# Patient Record
Sex: Male | Born: 1960 | Race: Black or African American | Hispanic: No | Marital: Married | Smoking: Never smoker
Health system: Southern US, Community
[De-identification: ages and names within clinical notes are randomized; demographics above are authoritative.]

## PROBLEM LIST (undated history)

## (undated) DIAGNOSIS — E119 Type 2 diabetes mellitus without complications: Secondary | ICD-10-CM

## (undated) DIAGNOSIS — Z8619 Personal history of other infectious and parasitic diseases: Secondary | ICD-10-CM

## (undated) DIAGNOSIS — M1990 Unspecified osteoarthritis, unspecified site: Secondary | ICD-10-CM

## (undated) DIAGNOSIS — K219 Gastro-esophageal reflux disease without esophagitis: Secondary | ICD-10-CM

## (undated) DIAGNOSIS — J189 Pneumonia, unspecified organism: Secondary | ICD-10-CM

## (undated) DIAGNOSIS — I10 Essential (primary) hypertension: Secondary | ICD-10-CM

## (undated) DIAGNOSIS — F419 Anxiety disorder, unspecified: Secondary | ICD-10-CM

## (undated) HISTORY — DX: Pneumonia, unspecified organism: J18.9

## (undated) HISTORY — DX: Personal history of other infectious and parasitic diseases: Z86.19

## (undated) HISTORY — PX: TONSILLECTOMY: SUR1361

## (undated) HISTORY — PX: COLONOSCOPY: SHX174

---

## 2003-04-20 ENCOUNTER — Encounter: Payer: Self-pay | Admitting: Cardiology

## 2003-04-20 ENCOUNTER — Ambulatory Visit (HOSPITAL_COMMUNITY): Admission: RE | Admit: 2003-04-20 | Discharge: 2003-04-20 | Payer: Self-pay | Admitting: Family Medicine

## 2005-10-01 ENCOUNTER — Emergency Department (HOSPITAL_COMMUNITY): Admission: EM | Admit: 2005-10-01 | Discharge: 2005-10-01 | Payer: Self-pay | Admitting: Family Medicine

## 2010-04-22 ENCOUNTER — Emergency Department (HOSPITAL_BASED_OUTPATIENT_CLINIC_OR_DEPARTMENT_OTHER)
Admission: EM | Admit: 2010-04-22 | Discharge: 2010-04-22 | Payer: Self-pay | Source: Home / Self Care | Admitting: Emergency Medicine

## 2010-04-22 ENCOUNTER — Ambulatory Visit: Payer: Self-pay | Admitting: Diagnostic Radiology

## 2013-10-11 ENCOUNTER — Emergency Department (HOSPITAL_BASED_OUTPATIENT_CLINIC_OR_DEPARTMENT_OTHER): Payer: BC Managed Care – PPO

## 2013-10-11 ENCOUNTER — Encounter (HOSPITAL_BASED_OUTPATIENT_CLINIC_OR_DEPARTMENT_OTHER): Payer: Self-pay | Admitting: Emergency Medicine

## 2013-10-11 ENCOUNTER — Emergency Department (HOSPITAL_BASED_OUTPATIENT_CLINIC_OR_DEPARTMENT_OTHER)
Admission: EM | Admit: 2013-10-11 | Discharge: 2013-10-11 | Disposition: A | Discharge status: Home / Self Care | Payer: BC Managed Care – PPO | Attending: Emergency Medicine | Admitting: Emergency Medicine

## 2013-10-11 DIAGNOSIS — B349 Viral infection, unspecified: Secondary | ICD-10-CM

## 2013-10-11 DIAGNOSIS — K0401 Reversible pulpitis: Secondary | ICD-10-CM

## 2013-10-11 DIAGNOSIS — Z79899 Other long term (current) drug therapy: Secondary | ICD-10-CM | POA: Insufficient documentation

## 2013-10-11 DIAGNOSIS — R197 Diarrhea, unspecified: Secondary | ICD-10-CM

## 2013-10-11 DIAGNOSIS — K029 Dental caries, unspecified: Secondary | ICD-10-CM | POA: Insufficient documentation

## 2013-10-11 DIAGNOSIS — B9789 Other viral agents as the cause of diseases classified elsewhere: Secondary | ICD-10-CM | POA: Insufficient documentation

## 2013-10-11 DIAGNOSIS — I10 Essential (primary) hypertension: Secondary | ICD-10-CM | POA: Insufficient documentation

## 2013-10-11 HISTORY — DX: Essential (primary) hypertension: I10

## 2013-10-11 LAB — CBC WITH DIFFERENTIAL/PLATELET
Band Neutrophils: 5 % (ref 0–10)
Basophils Absolute: 0 10*3/uL (ref 0.0–0.1)
Basophils Relative: 0 % (ref 0–1)
Eosinophils Absolute: 0 10*3/uL (ref 0.0–0.7)
Eosinophils Relative: 0 % (ref 0–5)
HCT: 36 % — ABNORMAL LOW (ref 39.0–52.0)
Hemoglobin: 12 g/dL — ABNORMAL LOW (ref 13.0–17.0)
Lymphocytes Relative: 32 % (ref 12–46)
Lymphs Abs: 1.6 10*3/uL (ref 0.7–4.0)
MCH: 29.9 pg (ref 26.0–34.0)
MCHC: 33.3 g/dL (ref 30.0–36.0)
MCV: 89.8 fL (ref 78.0–100.0)
Monocytes Absolute: 0.8 10*3/uL (ref 0.1–1.0)
Monocytes Relative: 15 % — ABNORMAL HIGH (ref 3–12)
Myelocytes: 1 %
Neutro Abs: 2.7 10*3/uL (ref 1.7–7.7)
Neutrophils Relative %: 47 % (ref 43–77)
Platelets: 128 10*3/uL — ABNORMAL LOW (ref 150–400)
RBC: 4.01 MIL/uL — ABNORMAL LOW (ref 4.22–5.81)
RDW: 13 % (ref 11.5–15.5)
WBC: 5.1 10*3/uL (ref 4.0–10.5)

## 2013-10-11 LAB — BASIC METABOLIC PANEL
BUN: 23 mg/dL (ref 6–23)
CO2: 23 mEq/L (ref 19–32)
Calcium: 8.8 mg/dL (ref 8.4–10.5)
Chloride: 103 mEq/L (ref 96–112)
Creatinine, Ser: 1.5 mg/dL — ABNORMAL HIGH (ref 0.50–1.35)
GFR calc Af Amer: 59 mL/min — ABNORMAL LOW (ref >90)
GFR calc non Af Amer: 51 mL/min — ABNORMAL LOW (ref >90)
Glucose, Bld: 130 mg/dL — ABNORMAL HIGH (ref 70–99)
Potassium: 4.3 mEq/L (ref 3.7–5.3)
Sodium: 140 mEq/L (ref 137–147)

## 2013-10-11 LAB — TROPONIN I: Troponin I: <0.3 ng/mL (ref <0.30)

## 2013-10-11 MED ORDER — PENICILLIN V POTASSIUM 500 MG PO TABS: 500.0000 mg | ORAL_TABLET | Freq: Four times a day (QID) | ORAL | Status: AC

## 2013-10-11 MED ORDER — KETOROLAC TROMETHAMINE 60 MG/2ML IM SOLN: 60.0000 mg | Freq: Once | INTRAMUSCULAR | Status: DC

## 2013-10-11 MED ORDER — ONDANSETRON 8 MG PO TBDP
8.0000 mg | ORAL_TABLET | Freq: Once | ORAL | Status: AC
  Administered 2013-10-11: 8 mg via ORAL
  Filled 2013-10-11: qty 1

## 2013-10-11 MED ORDER — NAPROXEN 250 MG PO TABS
500.0000 mg | ORAL_TABLET | Freq: Once | ORAL | Status: AC
  Administered 2013-10-11: 500 mg via ORAL
  Filled 2013-10-11: qty 2

## 2013-10-11 MED ORDER — BENZONATATE 100 MG PO CAPS: 100.0000 mg | ORAL_CAPSULE | Freq: Three times a day (TID) | ORAL | Status: DC

## 2013-10-11 MED ORDER — NAPROXEN 375 MG PO TABS: 375.0000 mg | ORAL_TABLET | Freq: Two times a day (BID) | ORAL | Status: DC

## 2013-10-11 NOTE — ED Provider Notes (Addendum)
CSN: [id number]     Arrival date & time 10/11/13  0422 History   First MD Initiated Contact with Patient 10/11/13 0435     Chief Complaint  Patient presents with  . Dental Pain  . Illegal value: [    pain with cough     (Consider location/radiation/quality/duration/timing/severity/associated sxs/prior Treatment) Patient is a 53 y.o. male presenting with URI. The history is provided by the patient and the spouse.  URI Presenting symptoms: cough and facial pain   Presenting symptoms: no ear pain and no fever   Severity:  Moderate Onset quality:  Gradual Duration:  4 days Timing:  Constant Progression:  Worsening Chronicity:  New Relieved by:  Nothing Worsened by:  Nothing tried Ineffective treatments:  None tried Associated symptoms: myalgias   Associated symptoms: no headaches, no neck pain and no wheezing   Associated symptoms comment:  Vomiting and diarrhea Myalgias:    Location:  Generalized   Quality:  Aching   Severity:  Moderate   Onset quality:  Gradual   Duration:  2 days   Timing:  Constant   Progression:  Unchanged Risk factors: sick contacts   Risk factors: not elderly and no recent illness     Past Medical History  Diagnosis Date  . Hypertension    History reviewed. No pertinent past surgical history. History reviewed. No pertinent family history. History  Substance Use Topics  . Smoking status: Never Smoker   . Smokeless tobacco: Never Used  . Alcohol Use: No    Review of Systems  Constitutional: Negative for fever.  HENT: Negative for drooling and ear pain.   Respiratory: Positive for cough. Negative for shortness of breath and wheezing.   Cardiovascular: Negative for chest pain, palpitations and leg swelling.  Gastrointestinal: Positive for nausea, vomiting, abdominal pain and diarrhea.  Musculoskeletal: Positive for myalgias. Negative for neck pain.  Skin: Negative for rash.  Neurological: Negative for dizziness, syncope, facial asymmetry,  speech difficulty, weakness, light-headedness, numbness and headaches.  All other systems reviewed and are negative.      Allergies  Review of patient's allergies indicates no known allergies.  Home Medications   Current Outpatient Rx  Name  Route  Sig  Dispense  Refill  . amLODipine (NORVASC) 10 MG tablet   Oral   Take 10 mg by mouth daily.         Marland Kitchen labetalol (NORMODYNE) 300 MG tablet   Oral   Take 300 mg by mouth 2 (two) times daily.         Marland Kitchen spironolactone (ALDACTONE) 50 MG tablet   Oral   Take 50 mg by mouth 2 (two) times daily.          BP 137/87  Pulse 67  Temp(Src) 98.7 F (37.1 C) (Oral)  Resp 18  Ht 6\' 2"  (1.88 m)  Wt 210 lb (95.255 kg)  BMI 26.95 kg/m2  SpO2 100% Physical Exam  Constitutional: He is oriented to person, place, and time. He appears well-developed and well-nourished.  HENT:  Head: Normocephalic and atraumatic. Head is without raccoon's eyes and without Battle's sign.  Right Ear: No mastoid tenderness.  Left Ear: No mastoid tenderness.  Mouth/Throat: Oropharynx is clear and moist. No oropharyngeal exudate.    Facial pain in cheek overlying upper teeth, no sinue tenderness  Eyes: Conjunctivae and EOM are normal. Pupils are equal, round, and reactive to light.  Neck: Normal range of motion. Neck supple.  Cardiovascular: Normal rate, regular rhythm and intact distal  pulses.   Pulmonary/Chest: Effort normal and breath sounds normal. He has no wheezes. He has no rales.  Abdominal: Soft. Bowel sounds are normal. There is no tenderness. There is no rebound and no guarding.  Musculoskeletal: Normal range of motion.  Neurological: He is alert and oriented to person, place, and time. He has normal reflexes. He displays normal reflexes. No cranial nerve deficit. He exhibits normal muscle tone. Coordination normal.  Intact sensation B  Skin: Skin is warm and dry.  Psychiatric: He has a normal mood and affect.    ED Course  Procedures  (including critical care time) Labs Review Labs Reviewed  CBC WITH DIFFERENTIAL  BASIC METABOLIC PANEL  TROPONIN I   Imaging Review No results found.   EKG Interpretation   Date/Time:  Monday October 11 2013 04:41:01 EST Ventricular Rate:  73 PR Interval:  184 QRS Duration: 114 QT Interval:  390 QTC Calculation: 429 R Axis:   4 Text Interpretation:  Normal sinus rhythm Confirmed by [facility]  MD,  Muneer Leider ([id number]) on 10/11/2013 4:50:34 AM      MDM  Rock Nephew:  0 Final diagnoses:  None  Doubt cardiac etiology, constellation of the patient's symptoms is viral in nature. Pain Patient has negative troponin and negative EKG.    Viral syndrome and facial pain consistent with dental caries and pulpitis.  Will treat nausea with zofran.  Cough with tesselon perles.  Will treat dental pain with naproxen and penicillin.  Follow up with your dentist.       Mallory Schaad K Taniaya Rudder-Rasch, MD 10/11/13 [phone]

## 2013-10-11 NOTE — ED Notes (Addendum)
Pt reports diarrhea, headache, left facial numbness, chest pain for 2 days. 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

## 2014-10-24 ENCOUNTER — Other Ambulatory Visit (INDEPENDENT_AMBULATORY_CARE_PROVIDER_SITE_OTHER): Payer: Self-pay | Admitting: Surgery

## 2014-10-24 DIAGNOSIS — R2230 Localized swelling, mass and lump, unspecified upper limb: Secondary | ICD-10-CM

## 2014-11-11 ENCOUNTER — Ambulatory Visit
Admission: RE | Admit: 2014-11-11 | Discharge: 2014-11-11 | Disposition: A | Discharge status: Home / Self Care | Payer: Self-pay | Source: Ambulatory Visit | Attending: Surgery | Admitting: Surgery

## 2014-11-11 MED ORDER — GADOBENATE DIMEGLUMINE 529 MG/ML IV SOLN
20.0000 mL | Freq: Once | INTRAVENOUS | Status: AC | PRN
  Administered 2014-11-11: 20 mL via INTRAVENOUS

## 2014-11-17 ENCOUNTER — Ambulatory Visit: Payer: Self-pay | Admitting: Surgery

## 2014-11-17 NOTE — H&P (Signed)
Justin Bates 10/24/2014 11:38 AM Location: [facility] Patient #: [id number] DOB: [birth year] Married / Language: English / Race: Black or African American Male  History of Present Illness Justin Bates A. Brent Taillon MD; 10/24/2014 12:40 PM) Patient words: right shoulder cyst  PT PRESENTS WITH 1 YEAR HISTORY OF RIGHT SHOULDER CYST. IT CAN BE PAINFIL AND SOMETIMES IS RED. LOCATION ANTERIOR RIGHT SHOULDER AND HAS ENLARGED. NO DRAINAGE OR HISTORY OF TRAUMA.  The patient is a 54 year old male    Other Problems Justin Bates, [place]; 10/24/2014 11:39 AM) High blood pressure  Past Surgical History Justin Bates, [place]; 10/24/2014 11:39 AM) No pertinent past surgical history  Diagnostic Studies History Justin Bates, [place]; 10/24/2014 11:39 AM) Colonoscopy never  Allergies Justin Bates, [place]; 10/24/2014 11:39 AM) No Known Drug Allergies03/14/2016  Medication History Justin Bates, [place]; 10/24/2014 11:40 AM) AmLODIPine Besylate (10MG  Tablet, Oral) Active. Labetalol HCl (300MG  Tablet, Oral) Active. Spironolactone (50MG  Tablet, Oral) Active.  Social History Justin Bates, [place]; 10/24/2014 11:39 AM) Caffeine use Tea. No alcohol use No drug use Tobacco use Never smoker.  Family History Justin Bates, [place]; 10/24/2014 11:39 AM) Cerebrovascular Accident Father, Mother. Hypertension Father, Mother.  Review of Systems (Justin Bates. Justin Bates; 10/24/2014 11:39 AM) General Not Present- Appetite Loss, Chills, Fatigue, Fever, Night Sweats, Weight Gain and Weight Loss. Skin Not Present- Change in Wart/Mole, Dryness, Hives, Jaundice, New Lesions, Non-Healing Wounds, Rash and Ulcer. HEENT Not Present- Earache, Hearing Loss, Hoarseness, Nose Bleed, Oral Ulcers, Ringing in the Ears, Seasonal Allergies, Sinus Pain, Sore Throat, Visual Disturbances, Wears glasses/contact lenses and Yellow Eyes. Respiratory Not Present- Bloody sputum, Chronic Cough, Difficulty  Breathing, Snoring and Wheezing. Breast Not Present- Breast Mass, Breast Pain, Nipple Discharge and Skin Changes. Cardiovascular Not Present- Chest Pain, Difficulty Breathing Lying Down, Leg Cramps, Palpitations, Rapid Heart Rate, Shortness of Breath and Swelling of Extremities. Gastrointestinal Not Present- Abdominal Pain, Bloating, Bloody Stool, Change in Bowel Habits, Chronic diarrhea, Constipation, Difficulty Swallowing, Excessive gas, Gets full quickly at meals, Hemorrhoids, Indigestion, Nausea, Rectal Pain and Vomiting. Male Genitourinary Not Present- Blood in Urine, Change in Urinary Stream, Frequency, Impotence, Nocturia, Painful Urination, Urgency and Urine Leakage.   Vitals [employer] R. Justin Bates; 10/24/2014 11:38 AM) 10/24/2014 11:38 AM Weight: 244.5 lb Height: 78in Body Surface Area: 2.47 m Body Mass Index: 28.25 kg/m BP: 126/82 (Sitting, Left Arm, Standard)    Physical Exam (Mckenna Boruff A. Harlyn Rathmann MD; 10/24/2014 12:41 PM) General Mental Status-Alert. General Appearance-Consistent with stated age. Hydration-Well hydrated. Voice-Normal.  Integumentary Note: 6 CM X 6 CM LOBULAR MASS ANTERIOR RIGHT SHOULDER NOT FIXED OR FLUCTUAMT MOBILE SOME RIGHT SHOULDER PAIN WITH MOVEMENT.   Chest and Lung Exam Chest and lung exam reveals -quiet, even and easy respiratory effort with no use of accessory muscles and on auscultation, normal breath sounds, no adventitious sounds and normal vocal resonance. Inspection Chest Wall - Normal. Back - normal.  Neurologic Neurologic evaluation reveals -alert and oriented x 3 with no impairment of recent or remote memory. Mental Status-Normal.  Musculoskeletal Normal Exam - Left-Upper Extremity Strength Normal and Lower Extremity Strength Normal. Normal Exam - Right-Upper Extremity Strength Normal and Lower Extremity Strength Normal.  Lymphatic Head & Neck  General Head & Neck Lymphatics: Bilateral - Description -  Normal. Axillary  General Axillary Region: Bilateral - Description - Normal. Tenderness - Non Tender. Femoral & Inguinal  Generalized Femoral & Inguinal Lymphatics: Bilateral - Description - Normal. Tenderness - Non Tender.    Assessment &  Plan ([name] A. Orra Nolde MD; 10/24/2014 12:05 PM) MASS OF SKIN OF RIGHT SHOULDER (782.2  R22.31) PAIN OF RIGHT SHOULDER REGION (719.41  M25.511) Impression: will need MRI to evaluate right shoulder mass and pain in right shoulder joint. if it is not involving any deep structures, nerves arteries or veins, will proceed with excision. if it involves joint space etc will need referal to specialist. risk of surgery are bleeding infection, nerve injury and sequela, blood vessel injuy joint space injury and the need for more surgery. He understands and agrees. Current Plans  Pt Education - POST OP   Signed by Turner Daniels, MD (10/24/2014 12:42 PM)

## 2014-12-16 ENCOUNTER — Encounter (HOSPITAL_BASED_OUTPATIENT_CLINIC_OR_DEPARTMENT_OTHER): Payer: Self-pay | Admitting: *Deleted

## 2014-12-16 NOTE — Progress Notes (Signed)
Coming for BMET and Ekg. Bring all medications.

## 2014-12-20 ENCOUNTER — Encounter (HOSPITAL_BASED_OUTPATIENT_CLINIC_OR_DEPARTMENT_OTHER)
Admission: RE | Admit: 2014-12-20 | Discharge: 2014-12-20 | Disposition: A | Discharge status: Home / Self Care | Payer: BLUE CROSS/BLUE SHIELD | Source: Ambulatory Visit | Attending: Surgery | Admitting: Surgery

## 2014-12-20 DIAGNOSIS — R9431 Abnormal electrocardiogram [ECG] [EKG]: Secondary | ICD-10-CM | POA: Diagnosis not present

## 2014-12-20 DIAGNOSIS — D1721 Benign lipomatous neoplasm of skin and subcutaneous tissue of right arm: Secondary | ICD-10-CM | POA: Diagnosis not present

## 2014-12-20 DIAGNOSIS — Z79899 Other long term (current) drug therapy: Secondary | ICD-10-CM | POA: Diagnosis not present

## 2014-12-20 DIAGNOSIS — I4510 Unspecified right bundle-branch block: Secondary | ICD-10-CM | POA: Diagnosis not present

## 2014-12-20 DIAGNOSIS — N289 Disorder of kidney and ureter, unspecified: Secondary | ICD-10-CM | POA: Diagnosis not present

## 2014-12-20 DIAGNOSIS — I10 Essential (primary) hypertension: Secondary | ICD-10-CM | POA: Diagnosis not present

## 2014-12-20 LAB — BASIC METABOLIC PANEL
Anion gap: 8 (ref 5–15)
BUN: 14 mg/dL (ref 6–20)
CO2: 29 mmol/L (ref 22–32)
Calcium: 8.9 mg/dL (ref 8.9–10.3)
Chloride: 103 mmol/L (ref 101–111)
Creatinine, Ser: 1.22 mg/dL (ref 0.61–1.24)
GFR calc Af Amer: >60 mL/min (ref >60)
GFR calc non Af Amer: >60 mL/min (ref >60)
Glucose, Bld: 114 mg/dL — ABNORMAL HIGH (ref 70–99)
Potassium: 3.7 mmol/L (ref 3.5–5.1)
Sodium: 140 mmol/L (ref 135–145)

## 2014-12-20 NOTE — Progress Notes (Signed)
Pt in for Pre op labwork and EKG.  EKG reviewed by Dr. Conrad Gilliam.

## 2014-12-21 ENCOUNTER — Ambulatory Visit (HOSPITAL_BASED_OUTPATIENT_CLINIC_OR_DEPARTMENT_OTHER): Payer: BLUE CROSS/BLUE SHIELD | Admitting: Anesthesiology

## 2014-12-21 ENCOUNTER — Ambulatory Visit (HOSPITAL_BASED_OUTPATIENT_CLINIC_OR_DEPARTMENT_OTHER)
Admission: RE | Admit: 2014-12-21 | Discharge: 2014-12-21 | Disposition: A | Discharge status: Home / Self Care | Payer: BLUE CROSS/BLUE SHIELD | Source: Ambulatory Visit | Attending: Surgery | Admitting: Surgery

## 2014-12-21 ENCOUNTER — Encounter (HOSPITAL_BASED_OUTPATIENT_CLINIC_OR_DEPARTMENT_OTHER): Payer: Self-pay | Admitting: *Deleted

## 2014-12-21 ENCOUNTER — Encounter (HOSPITAL_BASED_OUTPATIENT_CLINIC_OR_DEPARTMENT_OTHER)
Admission: RE | Disposition: A | Discharge status: Home / Self Care | Payer: Self-pay | Source: Ambulatory Visit | Attending: Surgery

## 2014-12-21 DIAGNOSIS — D1721 Benign lipomatous neoplasm of skin and subcutaneous tissue of right arm: Secondary | ICD-10-CM | POA: Insufficient documentation

## 2014-12-21 DIAGNOSIS — I10 Essential (primary) hypertension: Secondary | ICD-10-CM | POA: Insufficient documentation

## 2014-12-21 DIAGNOSIS — Z79899 Other long term (current) drug therapy: Secondary | ICD-10-CM | POA: Insufficient documentation

## 2014-12-21 DIAGNOSIS — N289 Disorder of kidney and ureter, unspecified: Secondary | ICD-10-CM | POA: Insufficient documentation

## 2014-12-21 DIAGNOSIS — R9431 Abnormal electrocardiogram [ECG] [EKG]: Secondary | ICD-10-CM | POA: Insufficient documentation

## 2014-12-21 DIAGNOSIS — I4510 Unspecified right bundle-branch block: Secondary | ICD-10-CM | POA: Insufficient documentation

## 2014-12-21 HISTORY — PX: LIPOMA EXCISION: SHX5283

## 2014-12-21 LAB — POCT HEMOGLOBIN-HEMACUE: Hemoglobin: 12.8 g/dL — ABNORMAL LOW (ref 13.0–17.0)

## 2014-12-21 SURGERY — EXCISION LIPOMA
Anesthesia: General | Site: Shoulder | Laterality: Right

## 2014-12-21 MED ORDER — CEFAZOLIN SODIUM-DEXTROSE 2-3 GM-% IV SOLR
2.0000 g | INTRAVENOUS | Status: AC
  Administered 2014-12-21: 2 g via INTRAVENOUS

## 2014-12-21 MED ORDER — FENTANYL CITRATE (PF) 100 MCG/2ML IJ SOLN
INTRAMUSCULAR | Status: DC | PRN
  Administered 2014-12-21: 100 ug via INTRAVENOUS

## 2014-12-21 MED ORDER — HYDRALAZINE HCL 20 MG/ML IJ SOLN
5.0000 mg | Freq: Once | INTRAMUSCULAR | Status: AC
  Administered 2014-12-21: 5 mg via INTRAVENOUS

## 2014-12-21 MED ORDER — HYDROCODONE-ACETAMINOPHEN 7.5-325 MG PO TABS: 1.0000 | ORAL_TABLET | Freq: Once | ORAL | Status: DC | PRN

## 2014-12-21 MED ORDER — FENTANYL CITRATE (PF) 100 MCG/2ML IJ SOLN: 50.0000 ug | INTRAMUSCULAR | Status: DC | PRN

## 2014-12-21 MED ORDER — FENTANYL CITRATE (PF) 100 MCG/2ML IJ SOLN
INTRAMUSCULAR | Status: AC
  Filled 2014-12-21: qty 4

## 2014-12-21 MED ORDER — HYDROMORPHONE HCL 1 MG/ML IJ SOLN: 0.2500 mg | INTRAMUSCULAR | Status: DC | PRN

## 2014-12-21 MED ORDER — MIDAZOLAM HCL 2 MG/2ML IJ SOLN
1.0000 mg | INTRAMUSCULAR | Status: DC | PRN
  Administered 2014-12-21: 2 mg via INTRAVENOUS

## 2014-12-21 MED ORDER — PROMETHAZINE HCL 25 MG/ML IJ SOLN: 6.2500 mg | INTRAMUSCULAR | Status: DC | PRN

## 2014-12-21 MED ORDER — OXYCODONE-ACETAMINOPHEN 5-325 MG PO TABS: 1.0000 | ORAL_TABLET | ORAL | Status: DC | PRN

## 2014-12-21 MED ORDER — LIDOCAINE HCL (CARDIAC) 20 MG/ML IV SOLN
INTRAVENOUS | Status: DC | PRN
  Administered 2014-12-21: 100 mg via INTRAVENOUS

## 2014-12-21 MED ORDER — HYDRALAZINE HCL 20 MG/ML IJ SOLN
INTRAMUSCULAR | Status: AC
  Filled 2014-12-21: qty 1

## 2014-12-21 MED ORDER — BUPIVACAINE-EPINEPHRINE 0.25% -1:200000 IJ SOLN
INTRAMUSCULAR | Status: DC | PRN
  Administered 2014-12-21: 10 mL

## 2014-12-21 MED ORDER — PROPOFOL 500 MG/50ML IV EMUL
INTRAVENOUS | Status: AC
  Filled 2014-12-21: qty 50

## 2014-12-21 MED ORDER — GLYCOPYRROLATE 0.2 MG/ML IJ SOLN: 0.2000 mg | Freq: Once | INTRAMUSCULAR | Status: DC | PRN

## 2014-12-21 MED ORDER — PROPOFOL 10 MG/ML IV BOLUS
INTRAVENOUS | Status: DC | PRN
  Administered 2014-12-21: 250 mg via INTRAVENOUS

## 2014-12-21 MED ORDER — LABETALOL HCL 5 MG/ML IV SOLN
5.0000 mg | Freq: Once | INTRAVENOUS | Status: AC
  Administered 2014-12-21: 5 mg via INTRAVENOUS

## 2014-12-21 MED ORDER — CHLORHEXIDINE GLUCONATE 4 % EX LIQD: 1.0000 "application " | Freq: Once | CUTANEOUS | Status: DC

## 2014-12-21 MED ORDER — LABETALOL HCL 5 MG/ML IV SOLN
INTRAVENOUS | Status: AC
  Filled 2014-12-21: qty 4

## 2014-12-21 MED ORDER — DEXAMETHASONE SODIUM PHOSPHATE 4 MG/ML IJ SOLN
INTRAMUSCULAR | Status: DC | PRN
  Administered 2014-12-21: 10 mg via INTRAVENOUS

## 2014-12-21 MED ORDER — LACTATED RINGERS IV SOLN
INTRAVENOUS | Status: DC
  Administered 2014-12-21 (×3): via INTRAVENOUS

## 2014-12-21 MED ORDER — MIDAZOLAM HCL 2 MG/2ML IJ SOLN
INTRAMUSCULAR | Status: AC
  Filled 2014-12-21: qty 2

## 2014-12-21 SURGICAL SUPPLY — 45 items
APL SKNCLS STERI-STRIP NONHPOA (GAUZE/BANDAGES/DRESSINGS)
BENZOIN TINCTURE PRP APPL 2/3 (GAUZE/BANDAGES/DRESSINGS) IMPLANT
BLADE SURG 10 STRL SS (BLADE) IMPLANT
BLADE SURG 15 STRL LF DISP TIS (BLADE) ×1 IMPLANT
BLADE SURG 15 STRL SS (BLADE) ×3
CANISTER SUCT 1200ML W/VALVE (MISCELLANEOUS) IMPLANT
CHLORAPREP W/TINT 26ML (MISCELLANEOUS) ×3 IMPLANT
CLOSURE WOUND 1/2 X4 (GAUZE/BANDAGES/DRESSINGS)
COVER BACK TABLE 60X90IN (DRAPES) ×3 IMPLANT
COVER MAYO STAND STRL (DRAPES) ×3 IMPLANT
DECANTER SPIKE VIAL GLASS SM (MISCELLANEOUS) IMPLANT
DRAPE LAPAROTOMY 100X72 PEDS (DRAPES) ×3 IMPLANT
DRAPE UTILITY XL STRL (DRAPES) ×3 IMPLANT
ELECT COATED BLADE 2.86 ST (ELECTRODE) ×3 IMPLANT
ELECT REM PT RETURN 9FT ADLT (ELECTROSURGICAL) ×3
ELECTRODE REM PT RTRN 9FT ADLT (ELECTROSURGICAL) ×1 IMPLANT
GLOVE BIOGEL M 7.0 STRL (GLOVE) ×2 IMPLANT
GLOVE BIOGEL PI IND STRL 7.5 (GLOVE) IMPLANT
GLOVE BIOGEL PI IND STRL 8 (GLOVE) ×1 IMPLANT
GLOVE BIOGEL PI INDICATOR 7.5 (GLOVE) ×4
GLOVE BIOGEL PI INDICATOR 8 (GLOVE) ×2
GLOVE ECLIPSE 8.0 STRL XLNG CF (GLOVE) ×3 IMPLANT
GOWN STRL REUS W/ TWL LRG LVL3 (GOWN DISPOSABLE) ×2 IMPLANT
GOWN STRL REUS W/TWL LRG LVL3 (GOWN DISPOSABLE) ×6
LIQUID BAND (GAUZE/BANDAGES/DRESSINGS) ×2 IMPLANT
NDL HYPO 25X1 1.5 SAFETY (NEEDLE) ×1 IMPLANT
NEEDLE HYPO 25X1 1.5 SAFETY (NEEDLE) ×3 IMPLANT
NS IRRIG 1000ML POUR BTL (IV SOLUTION) IMPLANT
PACK BASIN DAY SURGERY FS (CUSTOM PROCEDURE TRAY) ×3 IMPLANT
PENCIL BUTTON HOLSTER BLD 10FT (ELECTRODE) ×3 IMPLANT
SLEEVE SCD COMPRESS KNEE MED (MISCELLANEOUS) ×3 IMPLANT
SPONGE LAP 4X18 X RAY DECT (DISPOSABLE) ×2 IMPLANT
STAPLER VISISTAT 35W (STAPLE) IMPLANT
STRIP CLOSURE SKIN 1/2X4 (GAUZE/BANDAGES/DRESSINGS) IMPLANT
SUT MON AB 4-0 PC3 18 (SUTURE) ×3 IMPLANT
SUT VIC AB 3-0 SH 27 (SUTURE)
SUT VIC AB 3-0 SH 27X BRD (SUTURE) IMPLANT
SUT VICRYL 3-0 CR8 SH (SUTURE) IMPLANT
SUT VICRYL AB 3 0 TIES (SUTURE) IMPLANT
SYR CONTROL 10ML LL (SYRINGE) ×3 IMPLANT
TOWEL OR 17X24 6PK STRL BLUE (TOWEL DISPOSABLE) ×2 IMPLANT
TOWEL OR NON WOVEN STRL DISP B (DISPOSABLE) ×3 IMPLANT
TUBE CONNECTING 20'X1/4 (TUBING)
TUBE CONNECTING 20X1/4 (TUBING) IMPLANT
YANKAUER SUCT BULB TIP NO VENT (SUCTIONS) IMPLANT

## 2014-12-21 NOTE — Anesthesia Procedure Notes (Signed)
Procedure Name: LMA Insertion Date/Time: 12/21/2014 11:35 AM Performed by: Lyndee Leo Pre-anesthesia Checklist: Patient identified, Emergency Drugs available, Suction available and Patient being monitored Patient Re-evaluated:Patient Re-evaluated prior to inductionOxygen Delivery Method: Circle System Utilized Preoxygenation: Pre-oxygenation with 100% oxygen Intubation Type: IV induction Ventilation: Mask ventilation without difficulty LMA: LMA inserted LMA Size: 5.0 Number of attempts: 1 Airway Equipment and Method: Bite block Placement Confirmation: positive ETCO2 Tube secured with: Tape Dental Injury: Teeth and Oropharynx as per pre-operative assessment

## 2014-12-21 NOTE — Anesthesia Preprocedure Evaluation (Addendum)
Anesthesia Evaluation  Patient identified by MRN, date of birth, ID band Patient awake    Reviewed: Allergy & Precautions, NPO status , Patient's Chart, lab work & pertinent test results  Airway Mallampati: II  TM Distance: >3 FB Neck ROM: Full    Dental  (+) Teeth Intact   Pulmonary neg pulmonary ROS,  breath sounds clear to auscultation        Cardiovascular hypertension, Pt. on medications Rhythm:Regular Rate:Normal     Neuro/Psych negative neurological ROS     GI/Hepatic negative GI ROS, Neg liver ROS,   Endo/Other  negative endocrine ROS  Renal/GU Renal InsufficiencyRenal disease     Musculoskeletal   Abdominal   Peds  Hematology negative hematology ROS (+)   Anesthesia Other Findings   Reproductive/Obstetrics                            Anesthesia Physical Anesthesia Plan  ASA: II  Anesthesia Plan: General   Post-op Pain Management:    Induction: Intravenous  Airway Management Planned: LMA  Additional Equipment:   Intra-op Plan:   Post-operative Plan: Extubation in OR  Informed Consent: I have reviewed the patients History and Physical, chart, labs and discussed the procedure including the risks, benefits and alternatives for the proposed anesthesia with the patient or authorized representative who has indicated his/her understanding and acceptance.   Dental advisory given  Plan Discussed with: CRNA  Anesthesia Plan Comments:        Anesthesia Quick Evaluation

## 2014-12-21 NOTE — Transfer of Care (Signed)
Immediate Anesthesia Transfer of Care Note  Patient: Justin Bates  Procedure(s) Performed: Procedure(s): EXCISION RIGHT SHOULDER  LIPOMA (Right)  Patient Location: PACU  Anesthesia Type:General  Level of Consciousness: awake, sedated and patient cooperative  Airway & Oxygen Therapy: Patient Spontanous Breathing and Patient connected to face mask oxygen  Post-op Assessment: Report given to RN and Post -op Vital signs reviewed and stable  Post vital signs: Reviewed and stable  Last Vitals:  Filed Vitals:   12/21/14 0930  BP: 159/108  Pulse: 70  Temp: 36.9 C  Resp: 20    Complications: No apparent anesthesia complications

## 2014-12-21 NOTE — Interval H&P Note (Signed)
History and Physical Interval Note:  12/21/2014 9:34 AM  Justin Bates  has presented today for surgery, with the diagnosis of Right Shoulder Lipoma  The various methods of treatment have been discussed with the patient and family. After consideration of risks, benefits and other options for treatment, the patient has consented to  Procedure(s): EXCISION RIGHT SHOULDER  LIPOMA (Right) as a surgical intervention .  The patient's history has been reviewed, patient examined, no change in status, stable for surgery.  I have reviewed the patient's chart and labs.  Questions were answered to the patient's satisfaction.     Jorita Bohanon A.

## 2014-12-21 NOTE — Op Note (Signed)
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  Anesthesia: LMA with 0.25% Sensorcaine local with epinephrine  EBL: Minimal  Specimen: Right shoulder lipoma  Drains: None  Indications for procedure: The patient is a 54 year old male with a mass on his right shoulder. MRI was done which showed a subcutaneous lipoma. He also had some intra-articular changes. He desired excision of the lipoma. I explained that the lipoma was probably not causing shoulder pain but it had enlarged in the patient still desired excision. Risks, benefits and alternatives were discussed with the patient.The procedure has been discussed with the patient.  Alternative therapies have been discussed with the patient.  Operative risks include bleeding,  Infection,  Organ injury,  Nerve injury,  Blood vessel injury,  DVT,  Pulmonary embolism,  Death,  And possible reoperation.  Medical management risks include worsening of present situation.  The success of the procedure is 50 -90 % at treating patients symptoms.  The patient understands and agrees to proceed.  Description of procedure: Patient met in holding area and right shoulder marked as correct side. Patient then taken back to the operating room and placed upon the OR table. LMA anesthesia initiated without difficulty. Right shoulder prepped and draped in a sterile fashion. The lesion was anterior. Timeout was done. Proper patient and procedure verified. He received preoperative Ancef. 0.25% Sensorcaine was infiltrated the skin overlying the mass. Incision was made in a vertical fashion over the mass and a lipoma was encountered. This was excised in its entirety. This was separate from the deltoid muscles. This did not involve the muscle. Area was found hemostatic with cautery and closed with 3-0 Vicryl and 4-0 Monocryl. Liquid adhesive applied.  All final counts found to be correct. Patient awoke taken to recovery in satisfactory condition.

## 2014-12-21 NOTE — Discharge Instructions (Signed)
GENERAL SURGERY: POST OP INSTRUCTIONS ° °1. DIET: Follow a light bland diet the first 24 hours after arrival home, such as soup, liquids, crackers, etc.  Be sure to include lots of fluids daily.  Avoid fast food or heavy meals as your are more likely to get nauseated.   °2. Take your usually prescribed home medications unless otherwise directed. °3. PAIN CONTROL: °a. Pain is best controlled by a usual combination of three different methods TOGETHER: °i. Ice/Heat °ii. Over the counter pain medication °iii. Prescription pain medication °b. Most patients will experience some swelling and bruising around the incisions.  Ice packs or heating pads (30-60 minutes up to 6 times a day) will help. Use ice for the first few days to help decrease swelling and bruising, then switch to heat to help relax tight/sore spots and speed recovery.  Some people prefer to use ice alone, heat alone, alternating between ice & heat.  Experiment to what works for you.  Swelling and bruising can take several weeks to resolve.   °c. It is helpful to take an over-the-counter pain medication regularly for the first few weeks.  Choose one of the following that works best for you: °i. Naproxen (Aleve, etc)  Two 220mg tabs twice a day °ii. Ibuprofen (Advil, etc) Three 200mg tabs four times a day (every meal & bedtime) °iii. Acetaminophen (Tylenol, etc) 500-650mg four times a day (every meal & bedtime) °d. A  prescription for pain medication (such as oxycodone, hydrocodone, etc) should be given to you upon discharge.  Take your pain medication as prescribed.  °i. If you are having problems/concerns with the prescription medicine (does not control pain, nausea, vomiting, rash, itching, etc), please call us [phone] to see if we need to switch you to a different pain medicine that will work better for you and/or control your side effect better. °ii. If you need a refill on your pain medication, please contact your pharmacy.  They will contact our  office to request authorization. Prescriptions will not be filled after 5 pm or on week-ends. °4. Avoid getting constipated.  Between the surgery and the pain medications, it is common to experience some constipation.  Increasing fluid intake and taking a fiber supplement (such as Metamucil, Citrucel, FiberCon, MiraLax, etc) 1-2 times a day regularly will usually help prevent this problem from occurring.  A mild laxative (prune juice, Milk of Magnesia, MiraLax, etc) should be taken according to package directions if there are no bowel movements after 48 hours.   °5. Wash / shower every day.  You may shower over the dressings as they are waterproof.  Continue to shower over incision(s) after the dressing is off. °6. Remove your waterproof bandages 5 days after surgery.  You may leave the incision open to air.  You may have skin tapes (Steri Strips) covering the incision(s).  Leave them on until one week, then remove.  You may replace a dressing/Band-Aid to cover the incision for comfort if you wish.  ° ° ° ° °7. ACTIVITIES as tolerated:   °a. You may resume regular (light) daily activities beginning the next day--such as daily self-care, walking, climbing stairs--gradually increasing activities as tolerated.  If you can walk 30 minutes without difficulty, it is safe to try more intense activity such as jogging, treadmill, bicycling, low-impact aerobics, swimming, etc. °b. Save the most intensive and strenuous activity for last such as sit-ups, heavy lifting, contact sports, etc  Refrain from any heavy lifting or straining until you   are off narcotics for pain control.   °c. DO NOT PUSH THROUGH PAIN.  Let pain be your guide: If it hurts to do something, don't do it.  Pain is your body warning you to avoid that activity for another week until the pain goes down. °d. You may drive when you are no longer taking prescription pain medication, you can comfortably wear a seatbelt, and you can safely maneuver your car and  apply brakes. °e. You may have sexual intercourse when it is comfortable.  °8. FOLLOW UP in our office °a. Please call [facility] at [phone] to set up an appointment to see your surgeon in the office for a follow-up appointment approximately 2-3 weeks after your surgery. °b. Make sure that you call for this appointment the day you arrive home to insure a convenient appointment time. °9. IF YOU HAVE DISABILITY OR FAMILY LEAVE FORMS, BRING THEM TO THE OFFICE FOR PROCESSING.  DO NOT GIVE THEM TO YOUR DOCTOR. ° ° °WHEN TO CALL US [phone]: °1. Poor pain control °2. Reactions / problems with new medications (rash/itching, nausea, etc)  °3. Fever over 101.5 F (38.5 C) °4. Worsening swelling or bruising °5. Continued bleeding from incision. °6. Increased pain, redness, or drainage from the incision °7. Difficulty breathing / swallowing ° ° The clinic staff is available to answer your questions during regular business hours (8:30am-5pm).  Please don’t hesitate to call and ask to speak to one of our nurses for clinical concerns.  ° If you have a medical emergency, go to the nearest emergency room or call 911. ° A surgeon from [facility] is always on call at the hospitals ° ° °[facility] °[address], [address], [place], [place]  [address] ? °MAIN: [phone] ? TOLL FREE: [phone] ?  °FAX [phone] °[email or web address] ° °Post Anesthesia Home Care Instructions ° °Activity: °Get plenty of rest for the remainder of the day. A responsible adult should stay with you for 24 hours following the procedure.  °For the next 24 hours, DO NOT: °-Drive a car °-Operate machinery °-Drink alcoholic beverages °-Take any medication unless instructed by your physician °-Make any legal decisions or sign important papers. ° °Meals: °Start with liquid foods such as gelatin or soup. Progress to regular foods as tolerated. Avoid greasy, spicy, heavy foods. If nausea and/or  vomiting occur, drink only clear liquids until the nausea and/or vomiting subsides. Call your physician if vomiting continues. ° °Special Instructions/Symptoms: °Your throat may feel dry or sore from the anesthesia or the breathing tube placed in your throat during surgery. If this causes discomfort, gargle with warm salt water. The discomfort should disappear within 24 hours. ° °If you had a scopolamine patch placed behind your ear for the management of post- operative nausea and/or vomiting: ° °1. The medication in the patch is effective for 72 hours, after which it should be removed.  Wrap patch in a tissue and discard in the trash. Wash hands thoroughly with soap and water. °2. You may remove the patch earlier than 72 hours if you experience unpleasant side effects which may include dry mouth, dizziness or visual disturbances. °3. Avoid touching the patch. Wash your hands with soap and water after contact with the patch. °  ° °

## 2014-12-21 NOTE — H&P (Signed)
H&P   SPURGEON GANCARZ (MR# [id number])      H&P Info    Chief Strategy Officer Note Status Last Update User Last Update Date/Time   Erroll Luna, MD Signed Erroll Luna, MD 11/17/2014 3:06 PM    H&P    Expand All Collapse All   Ramin Zoll 10/24/2014 11:38 AM Location: [facility] Patient #: [id number] DOB: 27-Dec-1960 Married / Language: English / Race: Black or African American Male  History of Present Illness [name] A. Jock Mahon MD; 10/24/2014 12:40 PM) Patient words: right shoulder cyst  PT PRESENTS WITH 1 YEAR HISTORY OF RIGHT SHOULDER CYST. IT CAN BE PAINFIL AND SOMETIMES IS RED. LOCATION ANTERIOR RIGHT SHOULDER AND HAS ENLARGED. NO DRAINAGE OR HISTORY OF TRAUMA.  The patient is a 54 year old male    Other Problems [name], [place]; 10/24/2014 11:39 AM) High blood pressure  Past Surgical History [name], [place]; 10/24/2014 11:39 AM) No pertinent past surgical history  Diagnostic Studies History [name], [place]; 10/24/2014 11:39 AM) Colonoscopy never  Allergies [name], CMA; 10/24/2014 11:39 AM) No Known Drug Allergies03/14/2016  Medication History [name], CMA; 10/24/2014 11:40 AM) AmLODIPine Besylate (10MG  Tablet, Oral) Active. Labetalol HCl (300MG  Tablet, Oral) Active. Spironolactone (50MG  Tablet, Oral) Active.  Social History [name], [place]; 10/24/2014 11:39 AM) Caffeine use Tea. No alcohol use No drug use Tobacco use Never smoker.  Family History [name], [place]; 10/24/2014 11:39 AM) Cerebrovascular Accident Father, Mother. Hypertension Father, Mother.  Review of Systems ([place]. Brooks CMA; 10/24/2014 11:39 AM) General Not Present- Appetite Loss, Chills, Fatigue, Fever, Night Sweats, Weight Gain and Weight Loss. Skin Not Present- Change in Wart/Mole, Dryness, Hives, Jaundice, New Lesions, Non-Healing Wounds, Rash and Ulcer. HEENT Not Present- Earache, Hearing Loss, Hoarseness, Nose  Bleed, Oral Ulcers, Ringing in the Ears, Seasonal Allergies, Sinus Pain, Sore Throat, Visual Disturbances, Wears glasses/contact lenses and Yellow Eyes. Respiratory Not Present- Bloody sputum, Chronic Cough, Difficulty Breathing, Snoring and Wheezing. Breast Not Present- Breast Mass, Breast Pain, Nipple Discharge and Skin Changes. Cardiovascular Not Present- Chest Pain, Difficulty Breathing Lying Down, Leg Cramps, Palpitations, Rapid Heart Rate, Shortness of Breath and Swelling of Extremities. Gastrointestinal Not Present- Abdominal Pain, Bloating, Bloody Stool, Change in Bowel Habits, Chronic diarrhea, Constipation, Difficulty Swallowing, Excessive gas, Gets full quickly at meals, Hemorrhoids, Indigestion, Nausea, Rectal Pain and Vomiting. Male Genitourinary Not Present- Blood in Urine, Change in Urinary Stream, Frequency, Impotence, Nocturia, Painful Urination, Urgency and Urine Leakage.   Vitals [employer] R. Brooks CMA; 10/24/2014 11:38 AM) 10/24/2014 11:38 AM Weight: 244.5 lb Height: 78in Body Surface Area: 2.47 m Body Mass Index: 28.25 kg/m BP: 126/82 (Sitting, Left Arm, Standard)    Physical Exam (Kyna Blahnik A. Valkyrie Guardiola MD; 10/24/2014 12:41 PM) General Mental Status-Alert. General Appearance-Consistent with stated age. Hydration-Well hydrated. Voice-Normal.  Integumentary Note: 6 CM X 6 CM LOBULAR MASS ANTERIOR RIGHT SHOULDER NOT FIXED OR FLUCTUAMT MOBILE SOME RIGHT SHOULDER PAIN WITH MOVEMENT.   Chest and Lung Exam Chest and lung exam reveals -quiet, even and easy respiratory effort with no use of accessory muscles and on auscultation, normal breath sounds, no adventitious sounds and normal vocal resonance. Inspection Chest Wall - Normal. Back - normal.  Neurologic Neurologic evaluation reveals -alert and oriented x 3 with no impairment of recent or remote memory. Mental Status-Normal.  Musculoskeletal Normal Exam - Left-Upper Extremity Strength Normal  and Lower Extremity Strength Normal. Normal Exam - Right-Upper Extremity Strength Normal and Lower Extremity Strength Normal.  Lymphatic Head & Neck  General Head & Neck Lymphatics: Bilateral - Description - Normal. Axillary  General Axillary Region: Bilateral - Description - Normal. Tenderness - Non Tender. Femoral & Inguinal  Generalized Femoral & Inguinal Lymphatics: Bilateral - Description - Normal. Tenderness - Non Tender.    Assessment & Plan (Anderson Middlebrooks A. Jenessa Gillingham MD; 10/24/2014 12:05 PM) MASS OF SKIN OF RIGHT SHOULDER (782.2  R22.31) PAIN OF RIGHT SHOULDER REGION (719.41  M25.511) Impression: will need MRI to evaluate right shoulder mass and pain in right shoulder joint. if it is not involving any deep structures, nerves arteries or veins, will proceed with excision. if it involves joint space etc will need referal to specialist. risk of surgery are bleeding infection, nerve injury and sequela, blood vessel injuy joint space injury and the need for more surgery. He understands and agrees. Current Plans  Pt Education - POST OP   Signed by Turner Daniels, MD (10/24/2014 12:42 PM)

## 2014-12-21 NOTE — Anesthesia Postprocedure Evaluation (Signed)
  Anesthesia Post-op Note  Patient: Justin Bates  Procedure(s) Performed: Procedure(s): EXCISION RIGHT SHOULDER  LIPOMA (Right)  Patient Location: PACU  Anesthesia Type:General  Level of Consciousness: awake and alert   Airway and Oxygen Therapy: Patient Spontanous Breathing  Post-op Pain: none  Post-op Assessment: Post-op Vital signs reviewed  Post-op Vital Signs: Reviewed  Last Vitals:  Filed Vitals:   12/21/14 1445  BP: 148/104  Pulse: 72  Temp: 36.9 C  Resp:     Complications: No apparent anesthesia complications

## 2014-12-23 ENCOUNTER — Encounter (HOSPITAL_BASED_OUTPATIENT_CLINIC_OR_DEPARTMENT_OTHER): Payer: Self-pay | Admitting: Surgery

## 2015-12-17 ENCOUNTER — Encounter (HOSPITAL_BASED_OUTPATIENT_CLINIC_OR_DEPARTMENT_OTHER): Payer: Self-pay | Admitting: *Deleted

## 2015-12-17 ENCOUNTER — Emergency Department (HOSPITAL_BASED_OUTPATIENT_CLINIC_OR_DEPARTMENT_OTHER)
Admission: EM | Admit: 2015-12-17 | Discharge: 2015-12-17 | Disposition: A | Discharge status: Home / Self Care | Payer: BLUE CROSS/BLUE SHIELD | Attending: Emergency Medicine | Admitting: Emergency Medicine

## 2015-12-17 ENCOUNTER — Emergency Department (HOSPITAL_BASED_OUTPATIENT_CLINIC_OR_DEPARTMENT_OTHER): Payer: BLUE CROSS/BLUE SHIELD

## 2015-12-17 DIAGNOSIS — Z79899 Other long term (current) drug therapy: Secondary | ICD-10-CM | POA: Insufficient documentation

## 2015-12-17 DIAGNOSIS — I10 Essential (primary) hypertension: Secondary | ICD-10-CM | POA: Insufficient documentation

## 2015-12-17 DIAGNOSIS — R05 Cough: Secondary | ICD-10-CM | POA: Diagnosis present

## 2015-12-17 DIAGNOSIS — J189 Pneumonia, unspecified organism: Secondary | ICD-10-CM | POA: Insufficient documentation

## 2015-12-17 DIAGNOSIS — R109 Unspecified abdominal pain: Secondary | ICD-10-CM | POA: Insufficient documentation

## 2015-12-17 LAB — URINALYSIS, ROUTINE W REFLEX MICROSCOPIC
Bilirubin Urine: NEGATIVE
Glucose, UA: NEGATIVE mg/dL
Hgb urine dipstick: NEGATIVE
Ketones, ur: NEGATIVE mg/dL
Leukocytes, UA: NEGATIVE
Nitrite: NEGATIVE
Protein, ur: NEGATIVE mg/dL
Specific Gravity, Urine: 1.018 (ref 1.005–1.030)
pH: 6 (ref 5.0–8.0)

## 2015-12-17 LAB — CBC WITH DIFFERENTIAL/PLATELET
Basophils Absolute: 0 10*3/uL (ref 0.0–0.1)
Basophils Relative: 0 %
Eosinophils Absolute: 0.2 10*3/uL (ref 0.0–0.7)
Eosinophils Relative: 3 %
HCT: 34.5 % — ABNORMAL LOW (ref 39.0–52.0)
Hemoglobin: 11.6 g/dL — ABNORMAL LOW (ref 13.0–17.0)
Lymphocytes Relative: 17 %
Lymphs Abs: 1.1 10*3/uL (ref 0.7–4.0)
MCH: 30.4 pg (ref 26.0–34.0)
MCHC: 33.6 g/dL (ref 30.0–36.0)
MCV: 90.3 fL (ref 78.0–100.0)
Monocytes Absolute: 1 10*3/uL (ref 0.1–1.0)
Monocytes Relative: 14 %
Neutro Abs: 4.4 10*3/uL (ref 1.7–7.7)
Neutrophils Relative %: 66 %
Platelets: 181 10*3/uL (ref 150–400)
RBC: 3.82 MIL/uL — ABNORMAL LOW (ref 4.22–5.81)
RDW: 13.5 % (ref 11.5–15.5)
WBC: 6.7 10*3/uL (ref 4.0–10.5)

## 2015-12-17 LAB — COMPREHENSIVE METABOLIC PANEL
ALT: 23 U/L (ref 17–63)
AST: 26 U/L (ref 15–41)
Albumin: 3.7 g/dL (ref 3.5–5.0)
Alkaline Phosphatase: 50 U/L (ref 38–126)
Anion gap: 6 (ref 5–15)
BUN: 14 mg/dL (ref 6–20)
CO2: 24 mmol/L (ref 22–32)
Calcium: 8.5 mg/dL — ABNORMAL LOW (ref 8.9–10.3)
Chloride: 105 mmol/L (ref 101–111)
Creatinine, Ser: 1.38 mg/dL — ABNORMAL HIGH (ref 0.61–1.24)
GFR calc Af Amer: >60 mL/min (ref >60)
GFR calc non Af Amer: 56 mL/min — ABNORMAL LOW (ref >60)
Glucose, Bld: 152 mg/dL — ABNORMAL HIGH (ref 65–99)
Potassium: 3.3 mmol/L — ABNORMAL LOW (ref 3.5–5.1)
Sodium: 135 mmol/L (ref 135–145)
Total Bilirubin: 0.7 mg/dL (ref 0.3–1.2)
Total Protein: 7.6 g/dL (ref 6.5–8.1)

## 2015-12-17 LAB — LIPASE, BLOOD: Lipase: 16 U/L (ref 11–51)

## 2015-12-17 MED ORDER — SODIUM CHLORIDE 0.9 % IV BOLUS (SEPSIS)
1000.0000 mL | Freq: Once | INTRAVENOUS | Status: AC
  Administered 2015-12-17: 1000 mL via INTRAVENOUS

## 2015-12-17 MED ORDER — ONDANSETRON 4 MG PO TBDP: 4.0000 mg | ORAL_TABLET | Freq: Three times a day (TID) | ORAL | Status: DC | PRN

## 2015-12-17 MED ORDER — LEVOFLOXACIN 750 MG PO TABS
750.0000 mg | ORAL_TABLET | Freq: Once | ORAL | Status: AC
  Administered 2015-12-17: 750 mg via ORAL
  Filled 2015-12-17: qty 1

## 2015-12-17 MED ORDER — LEVOFLOXACIN 750 MG PO TABS: 750.0000 mg | ORAL_TABLET | Freq: Every day | ORAL | Status: DC

## 2015-12-17 MED ORDER — ONDANSETRON HCL 4 MG/2ML IJ SOLN
4.0000 mg | Freq: Once | INTRAMUSCULAR | Status: AC
  Administered 2015-12-17: 4 mg via INTRAVENOUS
  Filled 2015-12-17: qty 2

## 2015-12-17 NOTE — ED Notes (Signed)
Pt also reports abd pain that extends to R flank/back. Denies genitourinary symptoms.

## 2015-12-17 NOTE — Discharge Instructions (Signed)
You were seen and evaluated today for your cough and vomiting. These seem to be secondary to pneumonia. Take the antibiotics prescribed. Follow up outpatient with a primary care physician. Return for worsening symptoms or inability to take your antibiotics.   Community-Acquired Pneumonia, Adult Pneumonia is an infection of the lungs. There are different types of pneumonia. One type can develop while a person is in a hospital. A different type, called community-acquired pneumonia, develops in people who are not, or have not recently been, in the hospital or other health care facility.  CAUSES Pneumonia may be caused by bacteria, viruses, or funguses. Community-acquired pneumonia is often caused by Streptococcus pneumonia bacteria. These bacteria are often passed from one person to another by breathing in droplets from the cough or sneeze of an infected person. RISK FACTORS The condition is more likely to develop in:  People who havechronic diseases, such as chronic obstructive pulmonary disease (COPD), asthma, congestive heart failure, cystic fibrosis, diabetes, or kidney disease.  People who haveearly-stage or late-stage HIV.  People who havesickle cell disease.  People who havehad their spleen removed (splenectomy).  People who havepoor Human resources officer.  People who havemedical conditions that increase the risk of breathing in (aspirating) secretions their own mouth and nose.   People who havea weakened immune system (immunocompromised).  People who smoke.  People whotravel to areas where pneumonia-causing germs commonly exist.  People whoare around animal habitats or animals that have pneumonia-causing germs, including birds, bats, rabbits, cats, and farm animals. SYMPTOMS Symptoms of this condition include:  Adry cough.  A wet (productive) cough.  Fever.  Sweating.  Chest pain, especially when breathing deeply or coughing.  Rapid breathing or difficulty  breathing.  Shortness of breath.  Shaking chills.  Fatigue.  Muscle aches. DIAGNOSIS Your health care provider will take a medical history and perform a physical exam. You may also have other tests, including:  Imaging studies of your chest, including X-rays.  Tests to check your blood oxygen level and other blood gases.  Other tests on blood, mucus (sputum), fluid around your lungs (pleural fluid), and urine. If your pneumonia is severe, other tests may be done to identify the specific cause of your illness. TREATMENT The type of treatment that you receive depends on many factors, such as the cause of your pneumonia, the medicines you take, and other medical conditions that you have. For most adults, treatment and recovery from pneumonia may occur at home. In some cases, treatment must happen in a hospital. Treatment may include:  Antibiotic medicines, if the pneumonia was caused by bacteria.  Antiviral medicines, if the pneumonia was caused by a virus.  Medicines that are given by mouth or through an IV tube.  Oxygen.  Respiratory therapy. Although rare, treating severe pneumonia may include:  Mechanical ventilation. This is done if you are not breathing well on your own and you cannot maintain a safe blood oxygen level.  Thoracentesis. This procedureremoves fluid around one lung or both lungs to help you breathe better. HOME CARE INSTRUCTIONS  Take over-the-counter and prescription medicines only as told by your health care provider.  Only takecough medicine if you are losing sleep. Understand that cough medicine can prevent your body's natural ability to remove mucus from your lungs.  If you were prescribed an antibiotic medicine, take it as told by your health care provider. Do not stop taking the antibiotic even if you start to feel better.  Sleep in a semi-upright position at night. Try sleeping  in a reclining chair, or place a few pillows under your head.  Do  not use tobacco products, including cigarettes, chewing tobacco, and e-cigarettes. If you need help quitting, ask your health care provider.  Drink enough water to keep your urine clear or pale yellow. This will help to thin out mucus secretions in your lungs. PREVENTION There are ways that you can decrease your risk of developing community-acquired pneumonia. Consider getting a pneumococcal vaccine if:  You are older than 57 years of age.  You are older than 57 years of age and are undergoing cancer treatment, have chronic lung disease, or have other medical conditions that affect your immune system. Ask your health care provider if this applies to you. There are different types and schedules of pneumococcal vaccines. Ask your health care provider which vaccination option is best for you. You may also prevent community-acquired pneumonia if you take these actions:  Get an influenza vaccine every year. Ask your health care provider which type of influenza vaccine is best for you.  Go to the dentist on a regular basis.  Wash your hands often. Use hand sanitizer if soap and water are not available. SEEK MEDICAL CARE IF:  You have a fever.  You are losing sleep because you cannot control your cough with cough medicine. SEEK IMMEDIATE MEDICAL CARE IF:  You have worsening shortness of breath.  You have increased chest pain.  Your sickness becomes worse, especially if you are an older adult or have a weakened immune system.  You cough up blood.   This information is not intended to replace advice given to you by your health care provider. Make sure you discuss any questions you have with your health care provider.   Document Released: 07/29/2005 Document Revised: 04/19/2015 Document Reviewed: 11/23/2014 Elsevier Interactive Patient Education [employer].

## 2015-12-17 NOTE — ED Notes (Signed)
Pt reports that yesterday he developed a congested cough, chills and sweats, nasal congestion and drainage and bilateral ear pain. Denies sore throat. Reports diarrhea x2 and vomiting x2 since yesterday.

## 2015-12-17 NOTE — ED Provider Notes (Signed)
CSN: [id number]     Arrival date & time 12/17/15  1058 History   First MD Initiated Contact with Patient 12/17/15 1110     Chief Complaint  Patient presents with  . Cough     (Consider location/radiation/quality/duration/timing/severity/associated sxs/prior Treatment) HPI Comments: 55 year old male with history of hypertension presents for nausea, vomiting, cough. The patient reports that over the last 2-3 days after working out in the rain he has had cold sweats as well as chills. He has also had nasal congestion and fullness in his ears. Over this time his wife has also noted that the patient has been coughing and has seemed more short of breath than usual. He has also had pain in his abdomen and has had multiple episodes of vomiting. Reports normal urination and normal bowel habits.   Past Medical History  Diagnosis Date  . Hypertension    Past Surgical History  Procedure Laterality Date  . Tonsillectomy      as child  . Lipoma excision Right 12/21/2014    Procedure: EXCISION RIGHT SHOULDER  LIPOMA;  Surgeon: Erroll Luna, MD;  Location: [facility];  Service: General;  Laterality: Right;   No family history on file. Social History  Substance Use Topics  . Smoking status: Never Smoker   . Smokeless tobacco: Never Used  . Alcohol Use: No    Review of Systems  Constitutional: Positive for fever, chills and fatigue.  HENT: Positive for congestion, postnasal drip and rhinorrhea.   Eyes: Negative for visual disturbance.  Respiratory: Positive for cough and shortness of breath. Negative for chest tightness.   Cardiovascular: Negative for chest pain and palpitations.  Gastrointestinal: Positive for nausea and vomiting. Negative for abdominal pain and constipation.  Genitourinary: Negative for dysuria, urgency and frequency.  Musculoskeletal: Negative for myalgias and back pain.  Skin: Negative for rash.  Neurological: Negative for dizziness, weakness,  light-headedness and headaches.  Hematological: Does not bruise/bleed easily.      Allergies  Review of patient's allergies indicates no known allergies.  Home Medications   Prior to Admission medications   Medication Sig Start Date End Date Taking? Authorizing Provider  amLODipine (NORVASC) 10 MG tablet Take 10 mg by mouth daily.   Yes Historical Provider, MD  labetalol (NORMODYNE) 300 MG tablet Take 300 mg by mouth 2 (two) times daily.   Yes Historical Provider, MD  Multiple Vitamin (MULTIVITAMIN) tablet Take 1 tablet by mouth daily.   Yes Historical Provider, MD  spironolactone (ALDACTONE) 50 MG tablet Take 50 mg by mouth once.    Yes Historical Provider, MD  levofloxacin (LEVAQUIN) 750 MG tablet Take 1 tablet (750 mg total) by mouth daily. 12/18/15   Harvel Quale, MD  ondansetron (ZOFRAN ODT) 4 MG disintegrating tablet Take 1 tablet (4 mg total) by mouth every 8 (eight) hours as needed for nausea or vomiting. 12/17/15   Harvel Quale, MD   BP 146/94 mmHg  Pulse 78  Temp(Src) 99.1 F (37.3 C) (Oral)  Resp 18  Ht 6\' 6"  (1.981 m)  Wt 250 lb (113.399 kg)  BMI 28.90 kg/m2  SpO2 95% Physical Exam  Constitutional: He is oriented to person, place, and time. He appears well-developed and well-nourished. No distress.  HENT:  Head: Normocephalic and atraumatic.  Right Ear: External ear normal.  Left Ear: External ear normal.  Mouth/Throat: Oropharynx is clear and moist. No oropharyngeal exudate.  Eyes: EOM are normal. Pupils are equal, round, and reactive to light.  Neck: Normal  range of motion. Neck supple.  Cardiovascular: Normal rate, regular rhythm, normal heart sounds and intact distal pulses.   No murmur heard. Pulmonary/Chest: Effort normal. No respiratory distress. He has no wheezes. He has no rales.  Abdominal: Soft. He exhibits no distension. There is tenderness (mild right-sided). There is CVA tenderness (Right-sided).  Musculoskeletal: He exhibits no edema.   Neurological: He is alert and oriented to person, place, and time.  Skin: Skin is warm and dry. No rash noted. He is not diaphoretic.  Vitals reviewed.   ED Course  Procedures (including critical care time) Labs Review Labs Reviewed  CBC WITH DIFFERENTIAL/PLATELET - Abnormal; Notable for the following:    RBC 3.82 (*)    Hemoglobin 11.6 (*)    HCT 34.5 (*)    All other components within normal limits  COMPREHENSIVE METABOLIC PANEL - Abnormal; Notable for the following:    Potassium 3.3 (*)    Glucose, Bld 152 (*)    Creatinine, Ser 1.38 (*)    Calcium 8.5 (*)    GFR calc non Af Amer 56 (*)    All other components within normal limits  LIPASE, BLOOD  URINALYSIS, ROUTINE W REFLEX MICROSCOPIC (NOT AT [facility])    Imaging Review Dg Chest 2 View  12/17/2015  CLINICAL DATA:  Productive cough, nausea, vomiting, headache, and sternal chest pain since yesterday, history hypertension EXAM: CHEST  2 VIEW COMPARISON:  10/11/2013 FINDINGS: Upper normal heart size. Mediastinal contours and pulmonary vascularity normal. Lingular opacity most consistent with pneumonia. Remaining lungs clear. No pleural effusion or pneumothorax. Bones unremarkable. IMPRESSION: Lingular opacity most consistent with pneumonia. Followup PA and lateral chest X-ray is recommended in 3-4 weeks following trial of antibiotic therapy to ensure resolution and exclude underlying malignancy. Electronically Signed   By: Lavonia Dana M.D.   On: 12/17/2015 12:35   Ct Renal Stone Study  12/17/2015  CLINICAL DATA:  Right flank pain EXAM: CT ABDOMEN AND PELVIS WITHOUT CONTRAST TECHNIQUE: Multidetector CT imaging of the abdomen and pelvis was performed following the standard protocol without oral or intravenous contrast material administration. COMPARISON:  None. FINDINGS: Lower chest: There is airspace consolidation in the inferior lingula and a portion of the anterior segment of the left lower lobe. Hepatobiliary: There are scattered small  cysts throughout the liver. Largest cyst is located in the medial segment of the left lobe of the liver measuring 1.4 x 1.0 cm. No non cystic appearing liver lesions are evident on this noncontrast enhanced study. Gallbladder wall is not appreciably thickened. There is no biliary duct dilatation. Pancreas: There is no pancreatic mass or inflammatory focus. Spleen: No splenic lesions are evident. Adrenals/Urinary Tract: Adrenals appear normal bilaterally. There are multiple cysts throughout the left kidney with the largest cyst in the posterior parapelvic region measuring 6.2 x 5.7 cm. There is a small amount of calcification in the periphery of this cyst. Other left renal cysts appear simple in nature. There is a 9 x 7 mm cyst arising from the mid right kidney. There is no appreciable hydronephrosis on either side. There is a calculus in the lower pole of the left kidney anteriorly measuring 7 x 4 mm with adjacent calculus measuring 6 x 2 mm in this area. There are no ureteral calculi on either side. The urinary bladder is midline with wall thickness within normal limits. Stomach/Bowel: There are scattered sigmoid diverticula without diverticulitis. There is no bowel wall or mesenteric thickening. No bowel obstruction. No free air or portal venous air.  Vascular/Lymphatic: There is slight calcification in the abdominal aorta. There is no abdominal aortic aneurysm. No vascular lesions are evident on this noncontrast enhanced study. There is no adenopathy by size criteria in the abdomen or pelvis. There are, however, multiple subcentimeter. Prostatic pelvic lymph nodes. Reproductive: Prostate appears enlarged with impression on the inferior urinary bladder. Seminal vesicles appear unremarkable. No pelvic mass is apparent. Other: Appendix appears normal. No abscess or ascites is evident in the abdomen or pelvis. There is a minimal ventral hernia containing only fat. Musculoskeletal: There is degenerative change in the  lower lumbar spine. There are no blastic or lytic bone lesions. No intramuscular or abdominal wall lesions. IMPRESSION: Airspace consolidation consistent with pneumonia in the inferior lingula and anterior segment left lower lobe regions. There are multiple renal cysts as well as liver cysts. Suspect a degree of old all polycystic kidney disease. Note that the largest cyst on the left contains a small amount of peripheral calcification, a finding that may be seen with polycystic kidney disease. There are nonobstructing calculi in the anterior lower pole left kidney. No hydronephrosis on either side. No ureteral calculi. Enlarged prostate with several small periprostatic lymph nodes. Advise correlation with PSA given these findings. By size criteria, there is no frank adenopathy in the abdomen or pelvis. No bowel obstruction. No abscess. Appendix appears normal. There is a minimal ventral hernia containing only fat. Electronically Signed   By: Lowella Grip III M.D.   On: 12/17/2015 12:39   I have personally reviewed and evaluated these images and lab results as part of my medical decision-making.   EKG Interpretation None      MDM  Patient was seen and evaluated in stable condition. Laboratory results were unremarkable.  Imaging studies showed left-sided pneumonia in the lingula and the lower lobe. Patient tolerated orals including Levaquin for an antibiotic. He was requesting discharge. The results were discussed at length with him and his wife. He was discharged home in stable condition with strict return precautions. He was provided with a prescription for Levaquin. He was also informed of this cyst and incidental findings on his CT and instructed to address these with his primary care physician. Final diagnoses:  Right flank pain  Community acquired pneumonia    1. Community-acquired pneumonia    Harvel Quale, MD 12/17/15 1525

## 2015-12-17 NOTE — ED Notes (Signed)
PA in room with patient and family now.

## 2015-12-22 ENCOUNTER — Encounter: Payer: Self-pay | Admitting: Physician Assistant

## 2015-12-22 ENCOUNTER — Ambulatory Visit (INDEPENDENT_AMBULATORY_CARE_PROVIDER_SITE_OTHER): Payer: BLUE CROSS/BLUE SHIELD | Admitting: Physician Assistant

## 2015-12-22 VITALS — BP 137/96 | HR 82 | Temp 98.2°F | Resp 16 | Ht 76.5 in | Wt 241.4 lb

## 2015-12-22 DIAGNOSIS — Q613 Polycystic kidney, unspecified: Secondary | ICD-10-CM

## 2015-12-22 DIAGNOSIS — N200 Calculus of kidney: Secondary | ICD-10-CM | POA: Diagnosis not present

## 2015-12-22 DIAGNOSIS — J189 Pneumonia, unspecified organism: Secondary | ICD-10-CM | POA: Insufficient documentation

## 2015-12-22 DIAGNOSIS — N400 Enlarged prostate without lower urinary tract symptoms: Secondary | ICD-10-CM | POA: Diagnosis not present

## 2015-12-22 DIAGNOSIS — I10 Essential (primary) hypertension: Secondary | ICD-10-CM | POA: Insufficient documentation

## 2015-12-22 LAB — COMPREHENSIVE METABOLIC PANEL
ALT: 24 U/L (ref 0–53)
AST: 24 U/L (ref 0–37)
Albumin: 4.1 g/dL (ref 3.5–5.2)
Alkaline Phosphatase: 50 U/L (ref 39–117)
BUN: 18 mg/dL (ref 6–23)
CO2: 29 mEq/L (ref 19–32)
Calcium: 9.3 mg/dL (ref 8.4–10.5)
Chloride: 104 mEq/L (ref 96–112)
Creatinine, Ser: 1.27 mg/dL (ref 0.40–1.50)
GFR: 75.26 mL/min (ref >60.00)
Glucose, Bld: 119 mg/dL — ABNORMAL HIGH (ref 70–99)
Potassium: 3.8 mEq/L (ref 3.5–5.1)
Sodium: 141 mEq/L (ref 135–145)
Total Bilirubin: 0.3 mg/dL (ref 0.2–1.2)
Total Protein: 7.9 g/dL (ref 6.0–8.3)

## 2015-12-22 LAB — CBC
HCT: 36 % — ABNORMAL LOW (ref 39.0–52.0)
Hemoglobin: 11.7 g/dL — ABNORMAL LOW (ref 13.0–17.0)
MCHC: 32.4 g/dL (ref 30.0–36.0)
MCV: 90.5 fl (ref 78.0–100.0)
Platelets: 284 10*3/uL (ref 150.0–400.0)
RBC: 3.98 Mil/uL — ABNORMAL LOW (ref 4.22–5.81)
RDW: 13.5 % (ref 11.5–15.5)
WBC: 5 10*3/uL (ref 4.0–10.5)

## 2015-12-22 MED ORDER — TAMSULOSIN HCL 0.4 MG PO CAPS: 0.4000 mg | ORAL_CAPSULE | Freq: Every day | ORAL | Status: DC

## 2015-12-22 NOTE — Assessment & Plan Note (Signed)
Resolving. Examination is good today. Will complete entire course of Levaquin. Continue supportive measures. Will FU 3-4 weeks for repeat CXR.

## 2015-12-22 NOTE — Patient Instructions (Signed)
Please go to the lab for blood work. I will call with your results.  Please continue the entire course of the antibiotic. Stay well hydrated. Use Delsym if needed for cough. I will make a reminder to call you in 3-4 weeks so we can get a repeat CXR to make sure there are no residual abnormalities.  Please call your Nephrologist ([facility]) to schedule a follow-up appointment. Tell them you were in the ER and diagnosed with Polycystic Kidney disease.    Please start the Flomax after dinner as directed to help with urinary flow. I am checking your PSA levels today. If indicated, we will be setting you up with Urology.  Follow-up with me at your earliest convenience for a complete physical.

## 2015-12-22 NOTE — Assessment & Plan Note (Signed)
Noted on CT. Will check PSA today level today. + Nocturia. Will start Flomax. Will refer to Urology pending results due to nodes noted on CT.

## 2015-12-22 NOTE — Assessment & Plan Note (Signed)
Noted on CT. Non-obstructive. Asymptomatic. Will check calcium levels today.

## 2015-12-22 NOTE — Assessment & Plan Note (Signed)
Stable today. Asymptomatic. Followed by Special[facility]. Will monitor at visits.

## 2015-12-22 NOTE — Progress Notes (Signed)
Patient presents to clinic today to establish care. Patient is also an ER follow-up for CAP.  Patient presented to there ER on 12/17/15 with complaints of chest congestion, cough, fever and R flank pain. Workup included CXR that revealed a lingular pneumonia. Patient was started on PO Levaquin with instruction to follow-up with a PCP. CT Abdomen/Pelvis obtained due to flank pain revealing multiple non-obstructive renal stones as well as evidence of polycystic kidney disease affected the kidneys and liver. BUN, CT and liver enzymes within normal limits.  Prostate noted to be enlarged with periprostatic lymphadenopathy. No masses noted. Ct negative for other sites of adenopathy.  Since discharge, patient endorses taking the Levaquin daily as directed. Has 2 pills left. Denies fever, chest pain or SOB. Endorses mild cough and slight congestion but markedly improved. Coughing is really only present when he is up and being very active.   Endorses seeing Nephrology for hypertension who prescribes medications. States he has never been seen for renal issue there. Denies decreased urinary output. Endorses increased urination at night x 3-4. Denies urinary hesitancy. Endorses some mild erectile dysfunction of gradual onset. Denies unexplainable fevers or weight loss. Denies known family history of prostate cancer.   Chronic Issues: Hypertension -- Previously followed by Nephrology for BP. Is currently on a regimen of amlodipine, spironolactone and labetalol. Patient denies chest pain, palpitations, lightheadedness, dizziness, vision changes or frequent headaches.  BP Readings from Last 3 Encounters:  12/22/15 137/96  12/17/15 146/94  12/21/14 148/104    Past Medical History  Diagnosis Date  . Hypertension   . History of chicken pox   . Pneumonia     Past Surgical History  Procedure Laterality Date  . Tonsillectomy      as child  . Lipoma excision Right 12/21/2014    Procedure: EXCISION RIGHT  SHOULDER  LIPOMA;  Surgeon: Erroll Luna, MD;  Location: [place];  Service: General;  Laterality: Right;    Current Outpatient Prescriptions on File Prior to Visit  Medication Sig Dispense Refill  . amLODipine (NORVASC) 10 MG tablet Take 10 mg by mouth daily.    Marland Kitchen labetalol (NORMODYNE) 300 MG tablet Take 300 mg by mouth 2 (two) times daily.    Marland Kitchen levofloxacin (LEVAQUIN) 750 MG tablet Take 1 tablet (750 mg total) by mouth daily. 6 tablet 0  . Multiple Vitamin (MULTIVITAMIN) tablet Take 1 tablet by mouth daily.    . ondansetron (ZOFRAN ODT) 4 MG disintegrating tablet Take 1 tablet (4 mg total) by mouth every 8 (eight) hours as needed for nausea or vomiting. 20 tablet 0  . spironolactone (ALDACTONE) 50 MG tablet Take 50 mg by mouth once.      No current facility-administered medications on file prior to visit.    No Known Allergies  Family History  Problem Relation Age of Onset  . Hypertension Mother     Living  . Stroke Mother   . Hypertension Father     Living  . Stroke Father   . Healthy Brother     x4  . Healthy Sister     x5  . Healthy Son     x1  . Healthy Daughter     x2    Social History   Social History  . Marital Status: Married    Spouse Name: N/A  . Number of Children: 3  . Years of Education: N/A   Occupational History  . Not on file.   Social History Main Topics  .  Smoking status: Never Smoker   . Smokeless tobacco: Never Used  . Alcohol Use: No  . Drug Use: No  . Sexual Activity: Not on file   Other Topics Concern  . Not on file   Social History Narrative   Review of Systems  Constitutional: Negative for fever and malaise/fatigue.  Eyes: Negative for blurred vision and double vision.  Respiratory: Positive for cough. Negative for hemoptysis, shortness of breath and wheezing.   Cardiovascular: Negative for chest pain and palpitations.  Genitourinary: Negative for dysuria, urgency, frequency, hematuria and flank pain.        + Nocturia x 3-4  Neurological: Negative for dizziness, loss of consciousness and headaches.  Psychiatric/Behavioral: Negative for depression. The patient is not nervous/anxious.     BP 137/96 mmHg  Pulse 82  Temp(Src) 98.2 F (36.8 C) (Oral)  Resp 16  Ht 6' 4.5" (1.943 m)  Wt 241 lb 6 oz (109.487 kg)  BMI 29.00 kg/m2  SpO2 100%  Physical Exam  Constitutional: He is oriented to person, place, and time and well-developed, well-nourished, and in no distress.  HENT:  Head: Normocephalic and atraumatic.  Right Ear: External ear normal.  Left Ear: External ear normal.  Nose: Nose normal.  Mouth/Throat: Oropharynx is clear and moist. No oropharyngeal exudate.  TM within normal limits bilaterally  Eyes: Conjunctivae are normal. Pupils are equal, round, and reactive to light.  Neck: Neck supple. No thyromegaly present.  Cardiovascular: Normal rate, regular rhythm, normal heart sounds and intact distal pulses.   Pulmonary/Chest: Effort normal and breath sounds normal. No respiratory distress. He has no wheezes. He has no rales. He exhibits no tenderness.  Neurological: He is alert and oriented to person, place, and time.  Skin: Skin is warm and dry. No rash noted.  Psychiatric: Affect normal.  Vitals reviewed.   Recent Results (from the past 2160 hour(s))  CBC with Differential     Status: Abnormal   Collection Time: 12/17/15 11:55 AM  Result Value Ref Range   WBC 6.7 4.0 - 10.5 K/uL   RBC 3.82 (L) 4.22 - 5.81 MIL/uL   Hemoglobin 11.6 (L) 13.0 - 17.0 g/dL   HCT 34.5 (L) 39.0 - 52.0 %   MCV 90.3 78.0 - 100.0 fL   MCH 30.4 26.0 - 34.0 pg   MCHC 33.6 30.0 - 36.0 g/dL   RDW 13.5 11.5 - 15.5 %   Platelets 181 150 - 400 K/uL   Neutrophils Relative % 66 %   Neutro Abs 4.4 1.7 - 7.7 K/uL   Lymphocytes Relative 17 %   Lymphs Abs 1.1 0.7 - 4.0 K/uL   Monocytes Relative 14 %   Monocytes Absolute 1.0 0.1 - 1.0 K/uL   Eosinophils Relative 3 %   Eosinophils Absolute 0.2 0.0 - 0.7 K/uL     Basophils Relative 0 %   Basophils Absolute 0.0 0.0 - 0.1 K/uL  Comprehensive metabolic panel     Status: Abnormal   Collection Time: 12/17/15 11:55 AM  Result Value Ref Range   Sodium 135 135 - 145 mmol/L   Potassium 3.3 (L) 3.5 - 5.1 mmol/L   Chloride 105 101 - 111 mmol/L   CO2 24 22 - 32 mmol/L   Glucose, Bld 152 (H) 65 - 99 mg/dL   BUN 14 6 - 20 mg/dL   Creatinine, Ser 1.38 (H) 0.61 - 1.24 mg/dL   Calcium 8.5 (L) 8.9 - 10.3 mg/dL   Total Protein 7.6 6.5 - 8.1 g/dL  Albumin 3.7 3.5 - 5.0 g/dL   AST 26 15 - 41 U/L   ALT 23 17 - 63 U/L   Alkaline Phosphatase 50 38 - 126 U/L   Total Bilirubin 0.7 0.3 - 1.2 mg/dL   GFR calc non Af Amer 56 (L) >60 mL/min   GFR calc Af Amer >60 >60 mL/min    Comment: (NOTE) The eGFR has been calculated using the CKD EPI equation. This calculation has not been validated in all clinical situations. eGFR's persistently <60 mL/min signify possible Chronic Kidney Disease.    Anion gap 6 5 - 15  Lipase, blood     Status: None   Collection Time: 12/17/15 11:55 AM  Result Value Ref Range   Lipase 16 11 - 51 U/L  Urinalysis, Routine w reflex microscopic (not at [facility])     Status: None   Collection Time: 12/17/15  1:30 PM  Result Value Ref Range   Color, Urine YELLOW YELLOW   APPearance CLEAR CLEAR   Specific Gravity, Urine 1.018 1.005 - 1.030   pH 6.0 5.0 - 8.0   Glucose, UA NEGATIVE NEGATIVE mg/dL   Hgb urine dipstick NEGATIVE NEGATIVE   Bilirubin Urine NEGATIVE NEGATIVE   Ketones, ur NEGATIVE NEGATIVE mg/dL   Protein, ur NEGATIVE NEGATIVE mg/dL   Nitrite NEGATIVE NEGATIVE   Leukocytes, UA NEGATIVE NEGATIVE    Comment: MICROSCOPIC NOT DONE ON URINES WITH NEGATIVE PROTEIN, BLOOD, LEUKOCYTES, NITRITE, OR GLUCOSE <1000 mg/dL.    Assessment/Plan: Essential hypertension Stable today. Asymptomatic. Followed by Speciality. Will monitor at visits.  Polycystic kidney Noted on CT. Patient is followed by Nephrology for hypertension. Has never seen  patient for PKD. He is to schedule appointment. Renal function stable on recent labs. Will repeat labs today.  Renal calculus Noted on CT. Non-obstructive. Asymptomatic. Will check calcium levels today.  CAP (community acquired pneumonia) Resolving. Examination is good today. Will complete entire course of Levaquin. Continue supportive measures. Will FU 3-4 weeks for repeat CXR.  Prostate enlargement Noted on CT. Will check PSA today level today. + Nocturia. Will start Flomax. Will refer to Urology pending results due to nodes noted on CT.

## 2015-12-22 NOTE — Progress Notes (Signed)
Pre visit review using our clinic review tool, if applicable. No additional management support is needed unless otherwise documented below in the visit note/SLS  

## 2015-12-22 NOTE — Assessment & Plan Note (Signed)
Noted on CT. Patient is followed by Nephrology for hypertension. Has never seen patient for PKD. He is to schedule appointment. Renal function stable on recent labs. Will repeat labs today.

## 2015-12-23 LAB — PSA, TOTAL AND FREE
PSA, Free Pct: 19 % — ABNORMAL LOW (ref >25)
PSA, Free: 1.09 ng/mL
PSA: 5.88 ng/mL — ABNORMAL HIGH (ref <=4.00)

## 2016-01-02 ENCOUNTER — Telehealth: Payer: Self-pay | Admitting: *Deleted

## 2016-01-02 DIAGNOSIS — N400 Enlarged prostate without lower urinary tract symptoms: Secondary | ICD-10-CM

## 2016-01-02 DIAGNOSIS — R972 Elevated prostate specific antigen [PSA]: Secondary | ICD-10-CM

## 2016-01-02 NOTE — Telephone Encounter (Signed)
-----   Message from Brunetta Jeans, PA-C sent at 12/24/2015  6:06 PM EDT ----- Labs look good overall. Repeat kidney function and electrolytes look good. His PSA is elevated and giving CT scan findings needs further assessment by Urology. Please place urgent referral to Urology (Dx - Elevated PSA, Prostate Enlargement, Lymphadenopathy on CT Imaging.. Have him call us if he does not hear from them by Thursday this week.

## 2016-01-02 NOTE — Telephone Encounter (Signed)
Called and spoke with the pt and informed him of recent lab results and note.  Pt verbalized understanding and agreed to Urology referral.   Urgent referral to Urology ordered and sent.//AB/CMA

## 2016-01-06 ENCOUNTER — Telehealth: Payer: Self-pay | Admitting: Physician Assistant

## 2016-01-06 DIAGNOSIS — R9389 Abnormal findings on diagnostic imaging of other specified body structures: Secondary | ICD-10-CM

## 2016-01-06 NOTE — Telephone Encounter (Signed)
Please call patient and let him know I have placed an order for a repeat X-ray as discussed at his visit. This is to ensure resolution of abnormal chest x-ray findings to make sure there are no residual findings.

## 2016-01-09 NOTE — Telephone Encounter (Signed)
Called and spoke with the pt and informed him of the note below.  Pt verbalized understanding and agreed.//AB/CMA

## 2016-01-17 ENCOUNTER — Ambulatory Visit (HOSPITAL_BASED_OUTPATIENT_CLINIC_OR_DEPARTMENT_OTHER)
Admission: RE | Admit: 2016-01-17 | Discharge: 2016-01-17 | Disposition: A | Discharge status: Home / Self Care | Payer: BLUE CROSS/BLUE SHIELD | Source: Ambulatory Visit | Attending: Physician Assistant | Admitting: Physician Assistant

## 2016-01-17 DIAGNOSIS — R938 Abnormal findings on diagnostic imaging of other specified body structures: Secondary | ICD-10-CM | POA: Diagnosis not present

## 2016-01-17 DIAGNOSIS — R9389 Abnormal findings on diagnostic imaging of other specified body structures: Secondary | ICD-10-CM

## 2016-01-30 ENCOUNTER — Ambulatory Visit (INDEPENDENT_AMBULATORY_CARE_PROVIDER_SITE_OTHER): Payer: BLUE CROSS/BLUE SHIELD | Admitting: Physician Assistant

## 2016-01-30 ENCOUNTER — Encounter: Payer: Self-pay | Admitting: Physician Assistant

## 2016-01-30 VITALS — BP 126/90 | HR 70 | Temp 98.2°F | Resp 16 | Ht 76.5 in | Wt 239.2 lb

## 2016-01-30 DIAGNOSIS — I10 Essential (primary) hypertension: Secondary | ICD-10-CM

## 2016-01-30 NOTE — Progress Notes (Signed)
Pre visit review using our clinic review tool, if applicable. No additional management support is needed unless otherwise documented below in the visit note/SLS  

## 2016-01-30 NOTE — Progress Notes (Signed)
Patient presents to clinic today for follow-up of hypertension. Is currently on a regimen on amlodipine 10 mg, Labetalol 300 mg and Spironolactone 50 mg daily. Patient denies chest pain, palpitations, lightheadedness, dizziness, vision changes or frequent headaches.  BP Readings from Last 3 Encounters:  01/30/16 126/90  12/22/15 137/96  12/17/15 146/94   Past Medical History  Diagnosis Date  . Hypertension   . History of chicken pox   . Pneumonia     Current Outpatient Prescriptions on File Prior to Visit  Medication Sig Dispense Refill  . amLODipine (NORVASC) 10 MG tablet Take 10 mg by mouth daily.    . calcium-vitamin D (OSCAL WITH D) 500-200 MG-UNIT tablet Take 1 tablet by mouth daily with breakfast.    . labetalol (NORMODYNE) 300 MG tablet Take 300 mg by mouth 2 (two) times daily.    . Multiple Vitamin (MULTIVITAMIN) tablet Take 1 tablet by mouth daily.    . ondansetron (ZOFRAN ODT) 4 MG disintegrating tablet Take 1 tablet (4 mg total) by mouth every 8 (eight) hours as needed for nausea or vomiting. 20 tablet 0  . spironolactone (ALDACTONE) 50 MG tablet Take 50 mg by mouth once.      No current facility-administered medications on file prior to visit.    No Known Allergies  Family History  Problem Relation Age of Onset  . Hypertension Mother     Living  . Stroke Mother   . Hypertension Father     Living  . Stroke Father   . Healthy Brother     x4  . Healthy Sister     x5  . Healthy Son     x1  . Healthy Daughter     x2    Social History   Social History  . Marital Status: Married    Spouse Name: N/A  . Number of Children: 3  . Years of Education: N/A   Social History Main Topics  . Smoking status: Never Smoker   . Smokeless tobacco: Never Used  . Alcohol Use: No  . Drug Use: No  . Sexual Activity: Not Asked   Other Topics Concern  . None   Social History Narrative   Review of Systems - See HPI.  All other ROS are negative.  BP 126/90 mmHg   Pulse 70  Temp(Src) 98.2 F (36.8 C) (Oral)  Resp 16  Ht 6' 4.5" (1.943 m)  Wt 239 lb 4 oz (108.523 kg)  BMI 28.75 kg/m2  SpO2 98%  Physical Exam  Constitutional: He is oriented to person, place, and time and well-developed, well-nourished, and in no distress.  HENT:  Head: Normocephalic and atraumatic.  Eyes: Conjunctivae are normal.  Cardiovascular: Normal rate, regular rhythm, normal heart sounds and intact distal pulses.   Pulmonary/Chest: Effort normal and breath sounds normal. No respiratory distress. He has no wheezes. He has no rales. He exhibits no tenderness.  Neurological: He is alert and oriented to person, place, and time.  Skin: Skin is warm and dry. No rash noted.  Psychiatric: Affect normal.  Vitals reviewed.   Recent Results (from the past 2160 hour(s))  CBC with Differential     Status: Abnormal   Collection Time: 12/17/15 11:55 AM  Result Value Ref Range   WBC 6.7 4.0 - 10.5 K/uL   RBC 3.82 (L) 4.22 - 5.81 MIL/uL   Hemoglobin 11.6 (L) 13.0 - 17.0 g/dL   HCT 34.5 (L) 39.0 - 52.0 %   MCV 90.3 78.0 -  100.0 fL   MCH 30.4 26.0 - 34.0 pg   MCHC 33.6 30.0 - 36.0 g/dL   RDW 34.9 75.3 - 04.5 %   Platelets 181 150 - 400 K/uL   Neutrophils Relative % 66 %   Neutro Abs 4.4 1.7 - 7.7 K/uL   Lymphocytes Relative 17 %   Lymphs Abs 1.1 0.7 - 4.0 K/uL   Monocytes Relative 14 %   Monocytes Absolute 1.0 0.1 - 1.0 K/uL   Eosinophils Relative 3 %   Eosinophils Absolute 0.2 0.0 - 0.7 K/uL   Basophils Relative 0 %   Basophils Absolute 0.0 0.0 - 0.1 K/uL  Comprehensive metabolic panel     Status: Abnormal   Collection Time: 12/17/15 11:55 AM  Result Value Ref Range   Sodium 135 135 - 145 mmol/L   Potassium 3.3 (L) 3.5 - 5.1 mmol/L   Chloride 105 101 - 111 mmol/L   CO2 24 22 - 32 mmol/L   Glucose, Bld 152 (H) 65 - 99 mg/dL   BUN 14 6 - 20 mg/dL   Creatinine, Ser 9.66 (H) 0.61 - 1.24 mg/dL   Calcium 8.5 (L) 8.9 - 10.3 mg/dL   Total Protein 7.6 6.5 - 8.1 g/dL    Albumin 3.7 3.5 - 5.0 g/dL   AST 26 15 - 41 U/L   ALT 23 17 - 63 U/L   Alkaline Phosphatase 50 38 - 126 U/L   Total Bilirubin 0.7 0.3 - 1.2 mg/dL   GFR calc non Af Amer 56 (L) >60 mL/min   GFR calc Af Amer >60 >60 mL/min    Comment: (NOTE) The eGFR has been calculated using the CKD EPI equation. This calculation has not been validated in all clinical situations. eGFR's persistently <60 mL/min signify possible Chronic Kidney Disease.    Anion gap 6 5 - 15  Lipase, blood     Status: None   Collection Time: 12/17/15 11:55 AM  Result Value Ref Range   Lipase 16 11 - 51 U/L  Urinalysis, Routine w reflex microscopic (not at [facility])     Status: None   Collection Time: 12/17/15  1:30 PM  Result Value Ref Range   Color, Urine YELLOW YELLOW   APPearance CLEAR CLEAR   Specific Gravity, Urine 1.018 1.005 - 1.030   pH 6.0 5.0 - 8.0   Glucose, UA NEGATIVE NEGATIVE mg/dL   Hgb urine dipstick NEGATIVE NEGATIVE   Bilirubin Urine NEGATIVE NEGATIVE   Ketones, ur NEGATIVE NEGATIVE mg/dL   Protein, ur NEGATIVE NEGATIVE mg/dL   Nitrite NEGATIVE NEGATIVE   Leukocytes, UA NEGATIVE NEGATIVE    Comment: MICROSCOPIC NOT DONE ON URINES WITH NEGATIVE PROTEIN, BLOOD, LEUKOCYTES, NITRITE, OR GLUCOSE <1000 mg/dL.  PSA, total and free     Status: Abnormal   Collection Time: 12/22/15  9:26 AM  Result Value Ref Range   PSA 5.88 (H) <=4.00 ng/mL    Comment: Test Methodology: ECLIA PSA (Electrochemiluminescence Immunoassay)   For PSA values from 2.5-4.0, particularly in younger men <6 years old, the AUA and NCCN suggest testing for % Free PSA (3515) and evaluation of the rate of increase in PSA (PSA velocity).    PSA, Free 1.09 ng/mL   PSA, Free Pct 19 (L) >25 %    Comment:                        Probability of Prostate Cancer   (For Men with Non-Suspicious DRE Results and PSA Between  4 and   10 ng/mL, By Patient Age)     % free PSA                          Patient Age                          19 to 62  Years  27 to 59 Years  >70 Years    <=10%                  49.2%           57.5%          64.5%    11 - 18%               26.9%           33.9%          40.8%    19 - 25%               18.3%           23.9%          29.7%    >25%                    9.1%           12.2%          15.8%   CBC     Status: Abnormal   Collection Time: 12/22/15  9:26 AM  Result Value Ref Range   WBC 5.0 4.0 - 10.5 K/uL   RBC 3.98 (L) 4.22 - 5.81 Mil/uL   Platelets 284.0 150.0 - 400.0 K/uL   Hemoglobin 11.7 (L) 13.0 - 17.0 g/dL   HCT 36.0 (L) 39.0 - 52.0 %   MCV 90.5 78.0 - 100.0 fl   MCHC 32.4 30.0 - 36.0 g/dL   RDW 13.5 11.5 - 15.5 %  Comp Met (CMET)     Status: Abnormal   Collection Time: 12/22/15  9:26 AM  Result Value Ref Range   Sodium 141 135 - 145 mEq/L   Potassium 3.8 3.5 - 5.1 mEq/L   Chloride 104 96 - 112 mEq/L   CO2 29 19 - 32 mEq/L   Glucose, Bld 119 (H) 70 - 99 mg/dL   BUN 18 6 - 23 mg/dL   Creatinine, Ser 1.27 0.40 - 1.50 mg/dL   Total Bilirubin 0.3 0.2 - 1.2 mg/dL   Alkaline Phosphatase 50 39 - 117 U/L   AST 24 0 - 37 U/L   ALT 24 0 - 53 U/L   Total Protein 7.9 6.0 - 8.3 g/dL   Albumin 4.1 3.5 - 5.2 g/dL   Calcium 9.3 8.4 - 10.5 mg/dL   GFR 75.26 >60.00 mL/min    Assessment/Plan: Essential hypertension BP improved. Continue medications. DASH diet reviewed. He is working on getting a FU scheduled with Nephrology.      Leeanne Rio, PA-C

## 2016-01-30 NOTE — Patient Instructions (Signed)
Please continue BP medications as directed. You are getting too many fried foods that are high in salt in your diet.  Please follow the diet below. Follow-up in 2 months.  DASH Eating Plan DASH stands for "Dietary Approaches to Stop Hypertension." The DASH eating plan is a healthy eating plan that has been shown to reduce high blood pressure (hypertension). Additional health benefits may include reducing the risk of type 2 diabetes mellitus, heart disease, and stroke. The DASH eating plan may also help with weight loss. WHAT DO I NEED TO KNOW ABOUT THE DASH EATING PLAN? For the DASH eating plan, you will follow these general guidelines:  Choose foods with a percent daily value for sodium of less than 5% (as listed on the food label).  Use salt-free seasonings or herbs instead of table salt or sea salt.  Check with your health care provider or pharmacist before using salt substitutes.  Eat lower-sodium products, often labeled as "lower sodium" or "no salt added."  Eat fresh foods.  Eat more vegetables, fruits, and low-fat dairy products.  Choose whole grains. Look for the word "whole" as the first word in the ingredient list.  Choose fish and skinless chicken or [place] more often than red meat. Limit fish, poultry, and meat to 6 oz (170 g) each day.  Limit sweets, desserts, sugars, and sugary drinks.  Choose heart-healthy fats.  Limit cheese to 1 oz (28 g) per day.  Eat more home-cooked food and less restaurant, buffet, and fast food.  Limit fried foods.  Cook foods using methods other than frying.  Limit canned vegetables. If you do use them, rinse them well to decrease the sodium.  When eating at a restaurant, ask that your food be prepared with less salt, or no salt if possible. WHAT FOODS CAN I EAT? Seek help from a dietitian for individual calorie needs. Grains Whole grain or whole wheat bread. Szafran rice. Whole grain or whole wheat pasta. Quinoa, bulgur, and whole  grain cereals. Low-sodium cereals. Corn or whole wheat flour tortillas. Whole grain cornbread. Whole grain crackers. Low-sodium crackers. Vegetables Fresh or frozen vegetables (raw, steamed, roasted, or grilled). Low-sodium or reduced-sodium tomato and vegetable juices. Low-sodium or reduced-sodium tomato sauce and paste. Low-sodium or reduced-sodium canned vegetables.  Fruits All fresh, canned (in natural juice), or frozen fruits. Meat and Other Protein Products Ground beef (85% or leaner), grass-fed beef, or beef trimmed of fat. Skinless chicken or [place]. Ground chicken or [place]. Pork trimmed of fat. All fish and seafood. Eggs. Dried beans, peas, or lentils. Unsalted nuts and seeds. Unsalted canned beans. Dairy Low-fat dairy products, such as skim or 1% milk, 2% or reduced-fat cheeses, low-fat ricotta or cottage cheese, or plain low-fat yogurt. Low-sodium or reduced-sodium cheeses. Fats and Oils Tub margarines without trans fats. Light or reduced-fat mayonnaise and salad dressings (reduced sodium). Avocado. Safflower, olive, or canola oils. Natural peanut or almond butter. Other Unsalted popcorn and pretzels. The items listed above may not be a complete list of recommended foods or beverages. Contact your dietitian for more options. WHAT FOODS ARE NOT RECOMMENDED? Grains White bread. White pasta. White rice. Refined cornbread. Bagels and croissants. Crackers that contain trans fat. Vegetables Creamed or fried vegetables. Vegetables in a cheese sauce. Regular canned vegetables. Regular canned tomato sauce and paste. Regular tomato and vegetable juices. Fruits Dried fruits. Canned fruit in light or heavy syrup. Fruit juice. Meat and Other Protein Products Fatty cuts of meat. Ribs, chicken wings, bacon, sausage, bologna, salami,  chitterlings, fatback, hot dogs, bratwurst, and packaged luncheon meats. Salted nuts and seeds. Canned beans with salt. Dairy Whole or 2% milk, cream, half-and-half,  and cream cheese. Whole-fat or sweetened yogurt. Full-fat cheeses or blue cheese. Nondairy creamers and whipped toppings. Processed cheese, cheese spreads, or cheese curds. Condiments Onion and garlic salt, seasoned salt, table salt, and sea salt. Canned and packaged gravies. Worcestershire sauce. Tartar sauce. Barbecue sauce. Teriyaki sauce. Soy sauce, including reduced sodium. Steak sauce. Fish sauce. Oyster sauce. Cocktail sauce. Horseradish. Ketchup and mustard. Meat flavorings and tenderizers. Bouillon cubes. Hot sauce. Tabasco sauce. Marinades. Taco seasonings. Relishes. Fats and Oils Butter, stick margarine, lard, shortening, ghee, and bacon fat. Coconut, palm kernel, or palm oils. Regular salad dressings. Other Pickles and olives. Salted popcorn and pretzels. The items listed above may not be a complete list of foods and beverages to avoid. Contact your dietitian for more information. WHERE CAN I FIND MORE INFORMATION? National Heart, Lung, and Blood Institute: travelstabloid.com   This information is not intended to replace advice given to you by your health care provider. Make sure you discuss any questions you have with your health care provider.   Document Released: 07/18/2011 Document Revised: 08/19/2014 Document Reviewed: 06/02/2013 Elsevier Interactive Patient Education [employer].

## 2016-02-01 NOTE — Assessment & Plan Note (Signed)
BP improved. Continue medications. DASH diet reviewed. He is working on getting a FU scheduled with Nephrology.

## 2016-04-02 ENCOUNTER — Ambulatory Visit: Payer: BLUE CROSS/BLUE SHIELD | Admitting: Physician Assistant

## 2016-04-09 ENCOUNTER — Ambulatory Visit: Payer: BLUE CROSS/BLUE SHIELD | Admitting: Physician Assistant

## 2016-04-13 ENCOUNTER — Emergency Department (HOSPITAL_BASED_OUTPATIENT_CLINIC_OR_DEPARTMENT_OTHER): Payer: BLUE CROSS/BLUE SHIELD

## 2016-04-13 ENCOUNTER — Encounter (HOSPITAL_BASED_OUTPATIENT_CLINIC_OR_DEPARTMENT_OTHER): Payer: Self-pay

## 2016-04-13 ENCOUNTER — Emergency Department (HOSPITAL_BASED_OUTPATIENT_CLINIC_OR_DEPARTMENT_OTHER)
Admission: EM | Admit: 2016-04-13 | Discharge: 2016-04-13 | Disposition: A | Discharge status: Home / Self Care | Payer: BLUE CROSS/BLUE SHIELD | Attending: Emergency Medicine | Admitting: Emergency Medicine

## 2016-04-13 DIAGNOSIS — S92511A Displaced fracture of proximal phalanx of right lesser toe(s), initial encounter for closed fracture: Secondary | ICD-10-CM | POA: Diagnosis not present

## 2016-04-13 DIAGNOSIS — Y30XXXA Falling, jumping or pushed from a high place, undetermined intent, initial encounter: Secondary | ICD-10-CM | POA: Diagnosis not present

## 2016-04-13 DIAGNOSIS — Y929 Unspecified place or not applicable: Secondary | ICD-10-CM | POA: Diagnosis not present

## 2016-04-13 DIAGNOSIS — Y9339 Activity, other involving climbing, rappelling and jumping off: Secondary | ICD-10-CM | POA: Diagnosis not present

## 2016-04-13 DIAGNOSIS — Y999 Unspecified external cause status: Secondary | ICD-10-CM | POA: Insufficient documentation

## 2016-04-13 DIAGNOSIS — S92912A Unspecified fracture of left toe(s), initial encounter for closed fracture: Secondary | ICD-10-CM

## 2016-04-13 DIAGNOSIS — S99922A Unspecified injury of left foot, initial encounter: Secondary | ICD-10-CM | POA: Diagnosis present

## 2016-04-13 MED ORDER — IBUPROFEN 800 MG PO TABS: 800.0000 mg | ORAL_TABLET | Freq: Three times a day (TID) | ORAL | 0 refills | Status: DC

## 2016-04-13 MED ORDER — IBUPROFEN 800 MG PO TABS
800.0000 mg | ORAL_TABLET | Freq: Once | ORAL | Status: AC
  Administered 2016-04-13: 800 mg via ORAL
  Filled 2016-04-13: qty 1

## 2016-04-13 NOTE — Discharge Instructions (Signed)
You will need to use the post op shoe for 2 weeks-  see your doctor for further evaluation if having ongoing and chronic pain - you may need to see a podiatrist if you have ongoing pain - ice and elevate when resting - motrin 3 times a day

## 2016-04-13 NOTE — ED Triage Notes (Signed)
C/o injured left foot with a jump a work approx 1 week ago-NAD-steady gait

## 2016-04-13 NOTE — ED Provider Notes (Signed)
[facility] DEPT [facility] Provider Note   CSN: [id number] Arrival date & time: 04/13/16  1407     History   Chief Complaint Chief Complaint  Patient presents with  . Foot Injury    HPI Justin Bates is a 55 y.o. male.   The patient reports that one week ago he jumped from approximately a 5 foot height at work, landed on his left foot in an awkward way causing an inversion injury. He has had ongoing pain since that time with difficulty bearing weight. This is constant, mild to moderate, worse with ambulation, not associated with any numbness or weakness, no other injuries including knee back or neck      Past Medical History:  Diagnosis Date  . History of chicken pox   . Hypertension   . Pneumonia     Patient Active Problem List   Diagnosis Date Noted  . Essential hypertension 12/22/2015  . Polycystic kidney 12/22/2015  . Renal calculus 12/22/2015  . CAP (community acquired pneumonia) 12/22/2015  . Prostate enlargement 12/22/2015    Past Surgical History:  Procedure Laterality Date  . LIPOMA EXCISION Right 12/21/2014   Procedure: EXCISION RIGHT SHOULDER  LIPOMA;  Surgeon: Erroll Luna, MD;  Location: [place] [facility];  Service: General;  Laterality: Right;  . TONSILLECTOMY     as child       Home Medications    Prior to Admission medications   Medication Sig Start Date End Date Taking? Authorizing Provider  TAMSULOSIN HCL PO Take by mouth.   Yes Historical Provider, MD  amLODipine (NORVASC) 10 MG tablet Take 10 mg by mouth daily.    Historical Provider, MD  calcium-vitamin D (OSCAL WITH D) 500-200 MG-UNIT tablet Take 1 tablet by mouth daily with breakfast.    Historical Provider, MD  ibuprofen (ADVIL,MOTRIN) 800 MG tablet Take 1 tablet (800 mg total) by mouth 3 (three) times daily. 04/13/16   [name], MD  labetalol (NORMODYNE) 300 MG tablet Take 300 mg by mouth 2 (two) times daily.    Historical Provider, MD  Multiple Vitamin (MULTIVITAMIN)  tablet Take 1 tablet by mouth daily.    Historical Provider, MD  spironolactone (ALDACTONE) 50 MG tablet Take 50 mg by mouth once.     Historical Provider, MD    Family History Family History  Problem Relation Age of Onset  . Hypertension Mother     Living  . Stroke Mother   . Hypertension Father     Living  . Stroke Father   . Healthy Brother     x4  . Healthy Sister     x5  . Healthy Son     x1  . Healthy Daughter     x2    Social History Social History  Substance Use Topics  . Smoking status: Never Smoker  . Smokeless tobacco: Never Used  . Alcohol use No     Allergies   Review of patient's allergies indicates no known allergies.   Review of Systems Review of Systems  Musculoskeletal: Positive for joint swelling.  Skin: Negative for wound.  Neurological: Negative for weakness and numbness.     Physical Exam Updated Vital Signs BP 139/91 (BP Location: Right Arm)   Pulse 70   Temp 98 F (36.7 C) (Oral)   Resp 18   SpO2 100%   Physical Exam  Constitutional: He appears well-developed and well-nourished.  HENT:  Head: Normocephalic and atraumatic.  Eyes: Conjunctivae are normal. Right eye exhibits no  discharge. Left eye exhibits no discharge.  Pulmonary/Chest: Effort normal. No respiratory distress.  Musculoskeletal:  Tenderness of the left lateral malleolus, the left base of the fifth metatarsal and the left midfoot, normal pulses at the foot, no pain or decreased range of motion at the knee  Neurological: He is alert. Coordination normal.  No numbness or weakness of the left lower extremity  Skin: Skin is warm and dry. No rash noted. He is not diaphoretic. No erythema.  Psychiatric: He has a normal mood and affect.  Nursing note and vitals reviewed.    ED Treatments / Results  Labs (all labs ordered are listed, but only abnormal results are displayed) Labs Reviewed - No data to display  EKG  EKG Interpretation None       Radiology Dg  Ankle Complete Left  Result Date: 04/13/2016 CLINICAL DATA:  Status post trauma 2 weeks ago with pain of the left foot. EXAM: LEFT ANKLE COMPLETE - 3+ VIEW COMPARISON:  None. FINDINGS: There is no evidence of fracture, dislocation, or joint effusion. Soft tissues are unremarkable. IMPRESSION: Negative. Electronically Signed   By: Abelardo Diesel M.D.   On: 04/13/2016 15:24   Dg Foot Complete Left  Result Date: 04/13/2016 CLINICAL DATA:  Status post trauma to the left foot 2 weeks ago with persistent pain of the left foot EXAM: LEFT FOOT - COMPLETE 3+ VIEW COMPARISON:  None. FINDINGS: There is mild displaced fracture of the left fourth proximal phalanx. No other acute fracture or dislocation is identified. IMPRESSION: Fracture of the left fourth proximal phalanx. Electronically Signed   By: Abelardo Diesel M.D.   On: 04/13/2016 15:23    Procedures Procedures (including critical care time)  Medications Ordered in ED Medications  ibuprofen (ADVIL,MOTRIN) tablet 800 mg (800 mg Oral Given 04/13/16 1448)     Initial Impression / Assessment and Plan / ED Course  I have reviewed the triage vital signs and the nursing notes.  Pertinent labs & imaging results that were available during my care of the patient were reviewed by me and considered in my medical decision making (see chart for details).  Clinical Course  Comment By Time  X-rays reviewed, no signs of fracture of the foot or the ankle but small frx of toe on L, Ace wrap, ice packs, ongoing supportive care, patient is in agreement with the plan.   [name], MD 09/02 1515     visible swelling seen at the ankle on the left, image to rule out fracture, ice pack, elevation, Motrin, patient in agreement, no other injuries  Post op shoe  Final Clinical Impressions(s) / ED Diagnoses   Final diagnoses:  Toe fracture, left, closed, initial encounter    New Prescriptions New Prescriptions   IBUPROFEN (ADVIL,MOTRIN) 800 MG TABLET    Take 1  tablet (800 mg total) by mouth 3 (three) times daily.     [name], MD 04/13/16 [phone]

## 2016-04-16 ENCOUNTER — Encounter: Payer: Self-pay | Admitting: Physician Assistant

## 2016-04-16 ENCOUNTER — Ambulatory Visit (INDEPENDENT_AMBULATORY_CARE_PROVIDER_SITE_OTHER): Payer: BLUE CROSS/BLUE SHIELD | Admitting: Physician Assistant

## 2016-04-16 DIAGNOSIS — I10 Essential (primary) hypertension: Secondary | ICD-10-CM | POA: Diagnosis not present

## 2016-04-16 NOTE — Progress Notes (Signed)
Patient presents to clinic today for follow-up of hypertension. Patient is currently on a regimen of amlodipine 10 mg QD, Labetalol 300 mg BID and Aldactone 50 mg QD. Endorses taking medications as directed. Patient denies chest pain, palpitations, lightheadedness, dizziness, vision changes or frequent headaches.  BP Readings from Last 3 Encounters:  04/16/16 (!) 138/94  04/13/16 126/88  01/30/16 126/90   Past Medical History:  Diagnosis Date  . History of chicken pox   . Hypertension   . Pneumonia     Current Outpatient Prescriptions on File Prior to Visit  Medication Sig Dispense Refill  . amLODipine (NORVASC) 10 MG tablet Take 10 mg by mouth daily.    . calcium-vitamin D (OSCAL WITH D) 500-200 MG-UNIT tablet Take 1 tablet by mouth daily with breakfast.    . ibuprofen (ADVIL,MOTRIN) 800 MG tablet Take 1 tablet (800 mg total) by mouth 3 (three) times daily. 21 tablet 0  . labetalol (NORMODYNE) 300 MG tablet Take 300 mg by mouth 2 (two) times daily.    . Multiple Vitamin (MULTIVITAMIN) tablet Take 1 tablet by mouth daily.    Marland Kitchen spironolactone (ALDACTONE) 50 MG tablet Take 50 mg by mouth once.     . TAMSULOSIN HCL PO Take by mouth daily.      No current facility-administered medications on file prior to visit.     No Known Allergies  Family History  Problem Relation Age of Onset  . Hypertension Mother     Living  . Stroke Mother   . Hypertension Father     Living  . Stroke Father   . Healthy Brother     x4  . Healthy Sister     x5  . Healthy Son     x1  . Healthy Daughter     x2    Social History   Social History  . Marital status: Married    Spouse name: N/A  . Number of children: 3  . Years of education: N/A   Social History Main Topics  . Smoking status: Never Smoker  . Smokeless tobacco: Never Used  . Alcohol use No  . Drug use: No  . Sexual activity: Not Asked   Other Topics Concern  . None   Social History Narrative  . None    Review of  Systems - See HPI.  All other ROS are negative.  BP (!) 138/94 (BP Location: Right Arm, Patient Position: Sitting, Cuff Size: Large)   Pulse 88   Temp 98.2 F (36.8 C) (Oral)   Resp 16   Ht 6\' 5"  (1.956 m)   Wt 239 lb 6 oz (108.6 kg)   SpO2 97%   BMI 28.39 kg/m   Physical Exam  Constitutional: He is oriented to person, place, and time and well-developed, well-nourished, and in no distress.  HENT:  Head: Normocephalic and atraumatic.  Eyes: Conjunctivae are normal. Pupils are equal, round, and reactive to light.  Neck: Neck supple.  Cardiovascular: Normal rate, regular rhythm, normal heart sounds and intact distal pulses.   Pulmonary/Chest: Effort normal and breath sounds normal. No respiratory distress.  Neurological: He is alert and oriented to person, place, and time.  Skin: Skin is warm and dry. No rash noted.  Psychiatric: Affect normal.  Vitals reviewed.  Assessment/Plan: Essential hypertension BP very well controlled previously with current regimen. Mildly elevated today. Will hold off changing regimen at the moment as asymptomatic and patient with recent toe fx causing pain. Continue current regimen. DASH  diet discussed. FU scheduled.    Leeanne Rio, PA-C

## 2016-04-16 NOTE — Patient Instructions (Addendum)
Please take either the prescription Ibuprofen as directed when needed for pain. Other option would be to take long-acting aleve twice daily.  Buddy tape the toes when working.   Continue BP medications as directed. Follow-up in 6 months for physical.

## 2016-04-18 NOTE — Assessment & Plan Note (Signed)
BP very well controlled previously with current regimen. Mildly elevated today. Will hold off changing regimen at the moment as asymptomatic and patient with recent toe fx causing pain. Continue current regimen. DASH diet discussed. FU scheduled.

## 2016-10-16 ENCOUNTER — Encounter: Payer: BLUE CROSS/BLUE SHIELD | Admitting: Physician Assistant

## 2016-12-17 ENCOUNTER — Emergency Department (HOSPITAL_BASED_OUTPATIENT_CLINIC_OR_DEPARTMENT_OTHER): Payer: BLUE CROSS/BLUE SHIELD

## 2016-12-17 ENCOUNTER — Emergency Department (HOSPITAL_BASED_OUTPATIENT_CLINIC_OR_DEPARTMENT_OTHER)
Admission: EM | Admit: 2016-12-17 | Discharge: 2016-12-17 | Disposition: A | Discharge status: Home / Self Care | Payer: BLUE CROSS/BLUE SHIELD | Attending: Emergency Medicine | Admitting: Emergency Medicine

## 2016-12-17 ENCOUNTER — Encounter (HOSPITAL_BASED_OUTPATIENT_CLINIC_OR_DEPARTMENT_OTHER): Payer: Self-pay | Admitting: *Deleted

## 2016-12-17 DIAGNOSIS — I10 Essential (primary) hypertension: Secondary | ICD-10-CM | POA: Insufficient documentation

## 2016-12-17 DIAGNOSIS — M79604 Pain in right leg: Secondary | ICD-10-CM

## 2016-12-17 DIAGNOSIS — M660 Rupture of popliteal cyst: Secondary | ICD-10-CM | POA: Insufficient documentation

## 2016-12-17 DIAGNOSIS — M79661 Pain in right lower leg: Secondary | ICD-10-CM | POA: Diagnosis present

## 2016-12-17 DIAGNOSIS — Z79899 Other long term (current) drug therapy: Secondary | ICD-10-CM | POA: Insufficient documentation

## 2016-12-17 MED ORDER — HYDROCODONE-ACETAMINOPHEN 5-325 MG PO TABS: 1.0000 | ORAL_TABLET | Freq: Four times a day (QID) | ORAL | 0 refills | Status: DC | PRN

## 2016-12-17 MED ORDER — NAPROXEN 375 MG PO TABS: 375.0000 mg | ORAL_TABLET | Freq: Two times a day (BID) | ORAL | 0 refills | Status: DC

## 2016-12-17 NOTE — ED Notes (Signed)
Pt verbalizes understanding of d/c instructions and denies any further needs at this time. 

## 2016-12-17 NOTE — ED Triage Notes (Signed)
Pain, redness, swelling to his right lower leg for a week.

## 2016-12-17 NOTE — ED Notes (Signed)
ED Provider at bedside. 

## 2016-12-17 NOTE — ED Provider Notes (Signed)
[facility] Provider Note   CSN: [id number] Arrival date & time: 12/17/16  1737  By signing my name below, I, Justin Bates, attest that this documentation has been prepared under the direction and in the presence of Justin Schools, PA-C. Electronically Signed: Neta Bates, ED Scribe. 12/17/2016. 6:14 PM.    History   Chief Complaint Chief Complaint  Patient presents with  . Leg Pain   The history is provided by the patient. No language interpreter was used.   HPI Comments:  Justin Bates is a 56 y.o. male who presents to the Emergency Department complaining of constant worsening right lower leg pain and edema x 1 week. He states that the pain radiates from his right knee to the ankle. He denies any injury/trauma. Denies any hx of DVT/PE, and states that he rides in the car for 2 hours every day. He does not smoke, and denies any external hormone use,Recent hospitalizations/surgery. No alleviating factors noted. Denies fever, chills, lightheaded, dizziness, nausea, vomiting, cp, SOB, paresthesias.   Past Medical History:  Diagnosis Date  . History of chicken pox   . Hypertension   . Pneumonia     Patient Active Problem List   Diagnosis Date Noted  . Essential hypertension 12/22/2015  . Polycystic kidney 12/22/2015  . Renal calculus 12/22/2015  . CAP (community acquired pneumonia) 12/22/2015  . Prostate enlargement 12/22/2015    Past Surgical History:  Procedure Laterality Date  . LIPOMA EXCISION Right 12/21/2014   Procedure: EXCISION RIGHT SHOULDER  LIPOMA;  Surgeon: Erroll Luna, MD;  Location: [facility];  Service: General;  Laterality: Right;  . TONSILLECTOMY     as child       Home Medications    Prior to Admission medications   Medication Sig Start Date End Date Taking? Authorizing Provider  alfuzosin (UROXATRAL) 10 MG 24 hr tablet Take 10 mg by mouth daily with breakfast.   Yes [provider]  amLODipine  (NORVASC) 10 MG tablet Take 10 mg by mouth daily.   Yes [provider]  calcium-vitamin D (OSCAL WITH D) 500-200 MG-UNIT tablet Take 1 tablet by mouth daily with breakfast.   Yes [provider]  Multiple Vitamin (MULTIVITAMIN) tablet Take 1 tablet by mouth daily.   Yes [provider]  spironolactone (ALDACTONE) 50 MG tablet Take 50 mg by mouth once.    Yes [provider]  ibuprofen (ADVIL,MOTRIN) 800 MG tablet Take 1 tablet (800 mg total) by mouth 3 (three) times daily. 04/13/16   Justin Chapel, MD  labetalol (NORMODYNE) 300 MG tablet Take 300 mg by mouth 2 (two) times daily.    [provider]  TAMSULOSIN HCL PO Take by mouth daily.     [provider]    Family History Family History  Problem Relation Age of Onset  . Hypertension Mother     Living  . Stroke Mother   . Hypertension Father     Living  . Stroke Father   . Healthy Brother     x4  . Healthy Sister     x5  . Healthy Son     x1  . Healthy Daughter     x2    Social History Social History  Substance Use Topics  . Smoking status: Never Smoker  . Smokeless tobacco: Never Used  . Alcohol use No     Allergies   Patient has no known allergies.   Review of Systems Review of  Systems  Constitutional: Negative for chills and fever.  Respiratory: Negative for shortness of breath.   Cardiovascular: Positive for leg swelling. Negative for chest pain and palpitations.  Gastrointestinal: Negative for nausea and vomiting.  Musculoskeletal: Positive for arthralgias, joint swelling and myalgias.  Skin: Negative for color change.  Neurological: Negative for dizziness, syncope, weakness, numbness and headaches.  All other systems reviewed and are negative.    Physical Exam Updated Vital Signs BP (!) 131/97   Pulse 81   Temp 98.7 F (37.1 C) (Oral)   Resp 16   Ht 6\' 6"  (1.981 m)   Wt 239 lb (108.4 kg)   SpO2 98%   BMI 27.62 kg/m   Physical Exam    Constitutional: He appears well-developed and well-nourished. No distress.  HENT:  Head: Normocephalic and atraumatic.  Eyes: Conjunctivae are normal.  Cardiovascular: Normal rate, regular rhythm, normal heart sounds and intact distal pulses.   Pulmonary/Chest: Effort normal and breath sounds normal.  Abdominal: He exhibits no distension.  Musculoskeletal:  Edema to the right lower extremity from the right knee down to the right ankle. No erythema noted. No ecchymosis. Pain with palpation of the calf muscle and behind the right knee. DP pulses are 2+ bilaterally. Sensation intact. Cap refill normal. Strength 5 out of 5 in lower extremities.  Neurological: He is alert.  Skin: Skin is warm and dry. Capillary refill takes less than 2 seconds.  Psychiatric: He has a normal mood and affect.  Nursing note and vitals reviewed.    ED Treatments / Results  DIAGNOSTIC STUDIES:  Oxygen Saturation is 98% on RA, normal by my interpretation.    COORDINATION OF CARE:  6:13 PM Discussed treatment plan with pt at bedside and pt agreed to plan.   Labs (all labs ordered are listed, but only abnormal results are displayed) Labs Reviewed - No data to display  EKG  EKG Interpretation None       Radiology US Venous Img Lower Unilateral Right  Result Date: 12/17/2016 CLINICAL DATA:  Acute onset of right popliteal fossa and posterior calf pain. Initial encounter. EXAM: RIGHT LOWER EXTREMITY VENOUS DOPPLER ULTRASOUND TECHNIQUE: Gray-scale sonography with graded compression, as well as color Doppler and duplex ultrasound were performed to evaluate the lower extremity deep venous systems from the level of the common femoral vein and including the common femoral, femoral, profunda femoral, popliteal and calf veins including the posterior tibial, peroneal and gastrocnemius veins when visible. The superficial great saphenous vein was also interrogated. Spectral Doppler was utilized to evaluate flow at rest  and with distal augmentation maneuvers in the common femoral, femoral and popliteal veins. COMPARISON:  None. FINDINGS: Contralateral Common Femoral Vein: Respiratory phasicity is normal and symmetric with the symptomatic side. No evidence of thrombus. Normal compressibility. Common Femoral Vein: No evidence of thrombus. Normal compressibility, respiratory phasicity and response to augmentation. Saphenofemoral Junction: No evidence of thrombus. Normal compressibility and flow on color Doppler imaging. Profunda Femoral Vein: No evidence of thrombus. Normal compressibility and flow on color Doppler imaging. Femoral Vein: No evidence of thrombus. Normal compressibility, respiratory phasicity and response to augmentation. Popliteal Vein: No evidence of thrombus. Normal compressibility, respiratory phasicity and response to augmentation. Calf Veins: No evidence of thrombus. Normal compressibility and flow on color Doppler imaging. The peroneal vein is not visualized on this study. Superficial Great Saphenous Vein: No evidence of thrombus. Normal compressibility and flow on color Doppler imaging. Venous Reflux:  None. Other Findings: A large mildly complex collection of  fluid is seen extending inferiorly from the right popliteal fossa into the mid posterior calf, measuring 16.7 x 1.9 x 6.3 cm. IMPRESSION: 1. No evidence of DVT within the right lower extremity. 2. Apparent ruptured Baker's cyst noted, with mildly complex fluid extending inferiorly from the right popliteal fossa into the mid posterior calf, measuring 16.7 x 1.9 x 6.3 cm. Electronically Signed   By: Garald Balding M.D.   On: 12/17/2016 19:59    Procedures Procedures (including critical care time)  Medications Ordered in ED Medications - No data to display   Initial Impression / Assessment and Plan / ED Course  I have reviewed the triage vital signs and the nursing notes.  Pertinent labs & imaging results that were available during my care of the  patient were reviewed by me and considered in my medical decision making (see chart for details).     Patient presents to the emergency Department today with complaints of worsening right lower extremity edema and pain. On exam patient does have edema to the right lower extremity. No erythema concerning for cellulitis. He is neurovascularly intact. Initially concern for DVT. Lower extremity venous ultrasound was ordered. Shows no observable DVT but does show a complex ruptured Baker's cyst. The patient has no signs of compartment syndrome. Denies any chest pain or shortness of breath concerning for PE. Encouraged rest, elevation, compression, heat.  Vital signs are stable. Encouraged follow-up with PCP or return to the ED if symptoms are not improving or worsening. Discussed patient with Dr. Laverta Baltimore who is agreeable to above plan. Pt is hemodynamically stable, in NAD, & able to ambulate in the ED. Pain has been managed & has no complaints prior to dc. Pt is comfortable with above plan and is stable for discharge at this time. All questions were answered prior to disposition. Strict return precautions for f/u to the ED were discussed including chest pain or shortness of breath concerning for PE.   Final Clinical Impressions(s) / ED Diagnoses   Final diagnoses:  Ruptured Bakers cyst    New Prescriptions Discharge Medication List as of 12/17/2016  8:30 PM    START taking these medications   Details  HYDROcodone-acetaminophen (NORCO) 5-325 MG tablet Take 1 tablet by mouth every 6 (six) hours as needed., Starting Tue 12/17/2016, Print    naproxen (NAPROSYN) 375 MG tablet Take 1 tablet (375 mg total) by mouth 2 (two) times daily., Starting Tue 12/17/2016, Print      I personally performed the services described in this documentation, which was scribed in my presence. The recorded information has been reviewed and is accurate.     Doristine Devoid, PA-C 12/18/16 0255    Margette Fast, MD 12/18/16  [phone]

## 2016-12-17 NOTE — Discharge Instructions (Signed)
This seems to be a ruptured Baker cyst. I do not see any signs of infection. He had good blood flow and sensation to your foot. Make sure you are using compression on your right lower leg, resting, elevating, applying heat. May use the pain medicine and naproxen as needed. Get plenty of rest. If you develop any redness in the right lower leg, fevers, chest pain, shortness of breath, worsening swelling, inability to feel your right leg please return to the emergency department immediately. If symptoms do not resolve in the next 4-5 days please have follow-up with primary care doctor or return.

## 2016-12-22 ENCOUNTER — Emergency Department (HOSPITAL_BASED_OUTPATIENT_CLINIC_OR_DEPARTMENT_OTHER)
Admission: EM | Admit: 2016-12-22 | Discharge: 2016-12-22 | Disposition: A | Discharge status: Home / Self Care | Payer: BLUE CROSS/BLUE SHIELD | Attending: Emergency Medicine | Admitting: Emergency Medicine

## 2016-12-22 ENCOUNTER — Emergency Department (HOSPITAL_BASED_OUTPATIENT_CLINIC_OR_DEPARTMENT_OTHER): Payer: BLUE CROSS/BLUE SHIELD

## 2016-12-22 ENCOUNTER — Encounter (HOSPITAL_BASED_OUTPATIENT_CLINIC_OR_DEPARTMENT_OTHER): Payer: Self-pay | Admitting: Emergency Medicine

## 2016-12-22 DIAGNOSIS — M79604 Pain in right leg: Secondary | ICD-10-CM

## 2016-12-22 DIAGNOSIS — I10 Essential (primary) hypertension: Secondary | ICD-10-CM | POA: Diagnosis not present

## 2016-12-22 DIAGNOSIS — Z79899 Other long term (current) drug therapy: Secondary | ICD-10-CM | POA: Diagnosis not present

## 2016-12-22 DIAGNOSIS — M660 Rupture of popliteal cyst: Secondary | ICD-10-CM | POA: Diagnosis not present

## 2016-12-22 MED ORDER — HYDROCODONE-ACETAMINOPHEN 5-325 MG PO TABS
2.0000 | ORAL_TABLET | Freq: Once | ORAL | Status: AC
Start: 2016-12-22 — End: 2016-12-22
  Administered 2016-12-22: 2 via ORAL
  Filled 2016-12-22: qty 2

## 2016-12-22 NOTE — ED Provider Notes (Signed)
[facility] DEPT [facility] Provider Note   CSN: [id number] Arrival date & time: 12/22/16  1355     History   Chief Complaint Chief Complaint  Patient presents with  . Leg Pain    HPI Justin Bates is a 56 y.o. male who presents with persistent right lower extremity pain and swelling. Patient reports that he was seen at [facility] on 12/17/16 for right lower extremity pain/swelling. He had an ultrasound of the right lower extremity that showed a ruptured Baker cyst and no evidence of DVT. He was sent home with instructions to rest and ice the leg. He was given a prescription for pain medications. He reports that he went back to work immediately and has been walking on leg. He's been taking pain medication with relief of pain and has been using an Ace wrap. He reports that he started experiencing some worsening pain, especially to the calf of the right leg. He reports that his leg is still swollen, particularly at the calf. He denies any new trauma or injury. He denies any history of blood clots in legs or lungs, recent travel, recent surgery, recent immobilization. He denies any fever, warmth/redness to the leg, difficulty breathing or any other concerns.  The history is provided by the patient.    Past Medical History:  Diagnosis Date  . History of chicken pox   . Hypertension   . Pneumonia     Patient Active Problem List   Diagnosis Date Noted  . Essential hypertension 12/22/2015  . Polycystic kidney 12/22/2015  . Renal calculus 12/22/2015  . CAP (community acquired pneumonia) 12/22/2015  . Prostate enlargement 12/22/2015    Past Surgical History:  Procedure Laterality Date  . LIPOMA EXCISION Right 12/21/2014   Procedure: EXCISION RIGHT SHOULDER  LIPOMA;  Surgeon: Erroll Luna, MD;  Location: [facility];  Service: General;  Laterality: Right;  . TONSILLECTOMY     as child       Home Medications    Prior to Admission medications   Medication  Sig Start Date End Date Taking? Authorizing Provider  alfuzosin (UROXATRAL) 10 MG 24 hr tablet Take 10 mg by mouth daily with breakfast.    [provider]  amLODipine (NORVASC) 10 MG tablet Take 10 mg by mouth daily.    [provider]  calcium-vitamin D (OSCAL WITH D) 500-200 MG-UNIT tablet Take 1 tablet by mouth daily with breakfast.    [provider]  HYDROcodone-acetaminophen (NORCO) 5-325 MG tablet Take 1 tablet by mouth every 6 (six) hours as needed. 12/17/16   Doristine Devoid, PA-C  ibuprofen (ADVIL,MOTRIN) 800 MG tablet Take 1 tablet (800 mg total) by mouth 3 (three) times daily. 04/13/16   [name], MD  labetalol (NORMODYNE) 300 MG tablet Take 300 mg by mouth 2 (two) times daily.    [provider]  Multiple Vitamin (MULTIVITAMIN) tablet Take 1 tablet by mouth daily.    [provider]  naproxen (NAPROSYN) 375 MG tablet Take 1 tablet (375 mg total) by mouth 2 (two) times daily. 12/17/16   Doristine Devoid, PA-C  spironolactone (ALDACTONE) 50 MG tablet Take 50 mg by mouth once.     [provider]  TAMSULOSIN HCL PO Take by mouth daily.     [provider]    Family History Family History  Problem Relation Age of Onset  . Hypertension Mother        Living  . Stroke Mother   .  Hypertension Father        Living  . Stroke Father   . Healthy Brother        x4  . Healthy Sister        x5  . Healthy Son        x1  . Healthy Daughter        x2    Social History Social History  Substance Use Topics  . Smoking status: Never Smoker  . Smokeless tobacco: Never Used  . Alcohol use No     Allergies   Patient has no known allergies.   Review of Systems Review of Systems  Constitutional: Negative for fever.  Respiratory: Negative for shortness of breath.   Cardiovascular: Positive for leg swelling.  Musculoskeletal:       +Leg Pain  Skin: Negative for color change.     Physical Exam Updated Vital  Signs BP (!) 122/91 (BP Location: Right Arm)   Pulse 62   Temp 98.9 F (37.2 C) (Oral)   Resp 16   Ht 6\' 6"  (1.981 m)   Wt 108.9 kg   SpO2 98%   BMI 27.73 kg/m   Physical Exam  Constitutional: He appears well-developed and well-nourished.  HENT:  Head: Normocephalic and atraumatic.  Eyes: Conjunctivae and EOM are normal. Right eye exhibits no discharge. Left eye exhibits no discharge. No scleral icterus.  Pulmonary/Chest: Effort normal.  Musculoskeletal: He exhibits no deformity.  Right lower extremity with diffuse soft tissue swelling of the posterior calf. Some diffuse nonpitting edema to the right ankle. Tenderness to palpation of the right calf and right posterior lower leg. No surrounding warmth or erythema. Full range of motion of right foot. Mild nonpitting edema to left ankle.  Neurological: He is alert.  Skin: Skin is warm and dry.  Psychiatric: He has a normal mood and affect. His speech is normal and behavior is normal.  Nursing note and vitals reviewed.    ED Treatments / Results  Labs (all labs ordered are listed, but only abnormal results are displayed) Labs Reviewed - No data to display  EKG  EKG Interpretation None       Radiology US Venous Img Lower Unilateral Right  Result Date: 12/22/2016 CLINICAL DATA:  Right posterior knee pain and swelling radiating to posterior calf and ankle x2 weeks. EXAM: RIGHT LOWER EXTREMITY VENOUS DOPPLER ULTRASOUND TECHNIQUE: Gray-scale sonography with graded compression, as well as color Doppler and duplex ultrasound were performed to evaluate the lower extremity deep venous systems from the level of the common femoral vein and including the common femoral, femoral, profunda femoral, popliteal and calf veins including the posterior tibial, peroneal and gastrocnemius veins when visible. The superficial great saphenous vein was also interrogated. Spectral Doppler was utilized to evaluate flow at rest and with distal augmentation  maneuvers in the common femoral, femoral and popliteal veins. COMPARISON:  12/17/2016 FINDINGS: Contralateral Common Femoral Vein: Respiratory phasicity is normal and symmetric with the symptomatic side. No evidence of thrombus. Normal compressibility. Normal compressibility of the common femoral, superficial femoral, and popliteal veins, as well as the proximal calf veins. No filling defects to suggest DVT on grayscale or color Doppler imaging. Doppler waveforms show normal direction of venous flow, normal respiratory phasicity and response to augmentation. Visualized segments of the saphenous venous system are normal in caliber and compressibility Other Findings: Once again, a complex medial popliteal fossa fluid collection which may represent a ruptured Baker cyst is noted extending to mid calf currently  estimated at 17.8 x 3.6 x 2.5 cm IMPRESSION: No evidence of DVT within the right lower extremity. Popliteal complex fluid collection again noted currently estimated 17.8 x 3.6 x 2.5 cm versus 16.7 x 1.9 x 6.3 cm previously. Findings may represent sequela of a ruptured Baker's cyst tracking posterior aspect of the calf. Subacute hematoma is also a differential consideration. Electronically Signed   By: Ashley Royalty M.D.   On: 12/22/2016 18:03    Procedures Procedures (including critical care time)  Medications Ordered in ED Medications  HYDROcodone-acetaminophen (NORCO/VICODIN) 5-325 MG per tablet 2 tablet (2 tablets Oral Given 12/22/16 1638)     Initial Impression / Assessment and Plan / ED Course  I have reviewed the triage vital signs and the nursing notes.  Pertinent labs & imaging results that were available during my care of the patient were reviewed by me and considered in my medical decision making (see chart for details).     56 year old male with known ruptured Baker's cyst the right lower extremity presents with persistent right lower exudate pain and swelling. Patient had initially  been taking pain medication but hadn't gone back to work immediately and not been resting. He reports that pain started worsening if you days ago prompting repeat ED visit. No warmth or erythema noted. Physical exam shows some soft tissue swelling to the right calf. No surrounding warmth or erythema. Tenderness palpation to the right calf and medial shin area. Patient given pain medications in emergency department. Discussed with Dr. Tamera Punt. Will plan to repeat ultrasound of right lower extremity to evaluate for any presence of DVT. Discussed plan with patient. Started him that we will give him pain medications. We'll not send him with any more narcotics.  Signed out to Dr. Tamera Punt on shift change with ultrasound of right lower extremity pending.  Final Clinical Impressions(s) / ED Diagnoses   Final diagnoses:  Ruptured Bakers cyst  Pain of right lower extremity    New Prescriptions Discharge Medication List as of 12/22/2016  6:11 PM       Volanda Napoleon, PA-C 12/23/16 0037    Malvin Johns, MD 12/23/16 1601

## 2016-12-22 NOTE — ED Notes (Signed)
pateint given crackers and peanut butter to take with medications

## 2016-12-22 NOTE — ED Notes (Signed)
ED Provider at bedside. 

## 2016-12-22 NOTE — ED Triage Notes (Signed)
Patient states that he was here about a week ago and told that he had a ruptured bakers cyst. Reports that it has not gotten any better and want to rechecked

## 2016-12-22 NOTE — Discharge Instructions (Signed)
Follow up with your primary care doctor for in the next 24-48 hours for re-evaluation. If you do not have a primary care doctor, use the list provided to establish one.   Take ibuprofen or tylenol as needed for pain.  Use the ACE wrap to help with stabilization.   Return to the Emergency Dept for any worsening pain, swelling/redness to the leg, fever, or any other worsening or concerning symptoms.

## 2017-03-28 IMAGING — CT CT RENAL STONE PROTOCOL
2 of 4 series · 15 of 46 positions shown, 17 images · non-contrast
Comparison: None.

CLINICAL DATA: Right flank pain

EXAM:
CT ABDOMEN AND PELVIS WITHOUT CONTRAST
TECHNIQUE: Multidetector CT imaging of the abdomen and pelvis was performed
following the standard protocol without oral or intravenous contrast
material administration.

[Series 2: axial st · axial · 0.98mm/px · z∈[-480,-10]mm · 12 of 104 slices shown, 14 images]
[im 5/104  soft-tissue]
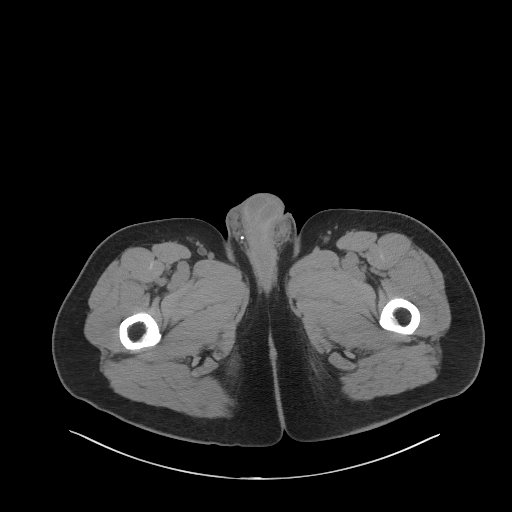
[im 5/104  bone]
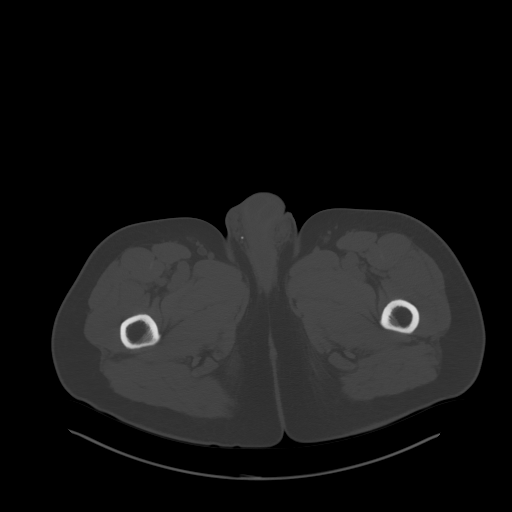
[im 13/104  soft-tissue]
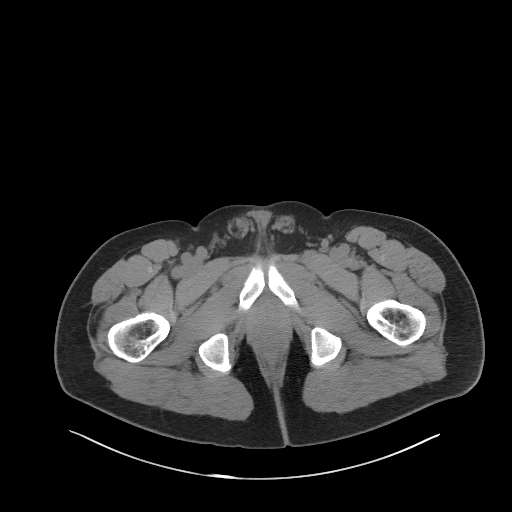
[im 22/104  soft-tissue]
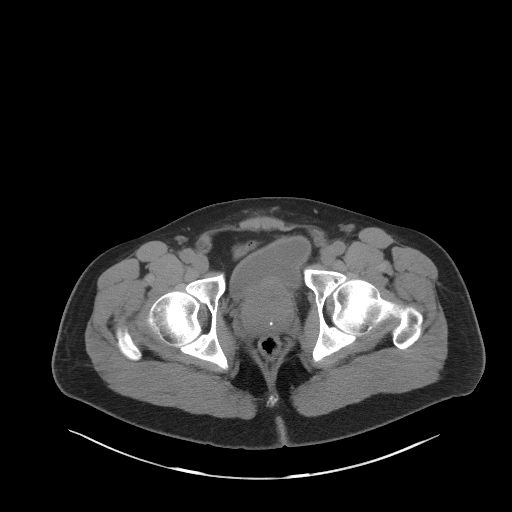
[im 31/104  soft-tissue]
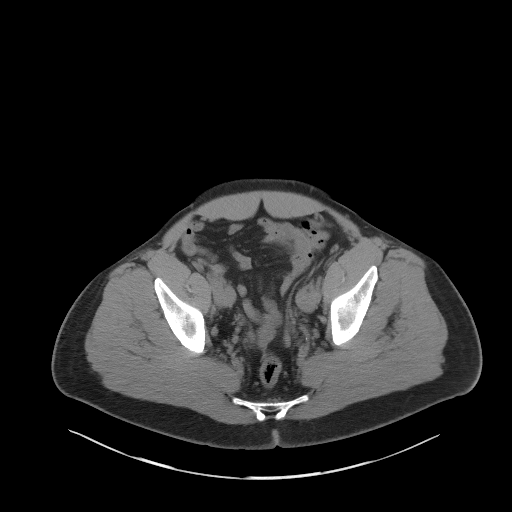
[im 39/104  soft-tissue]
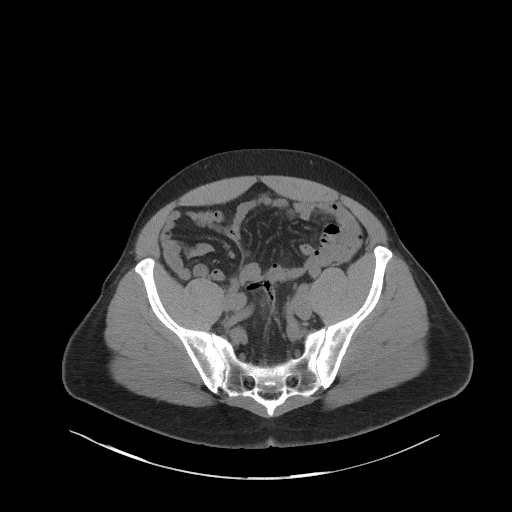
[im 48/104  soft-tissue]
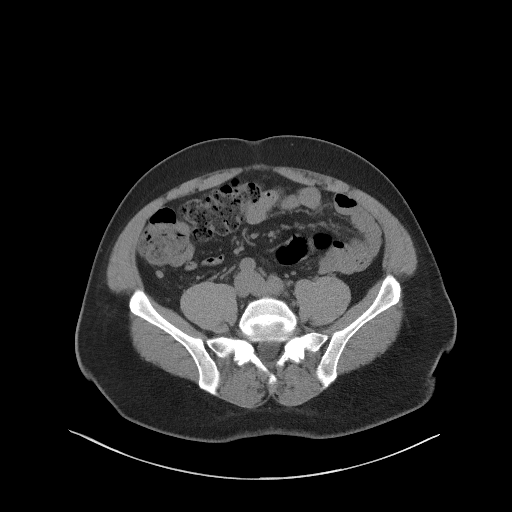
[im 56/104  soft-tissue]
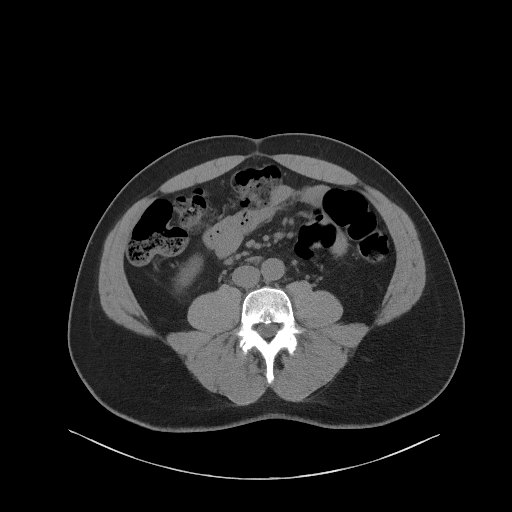
[im 65/104  soft-tissue]
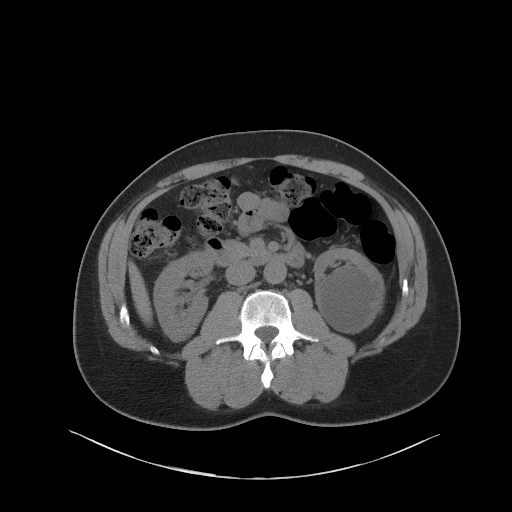
[im 73/104  soft-tissue]
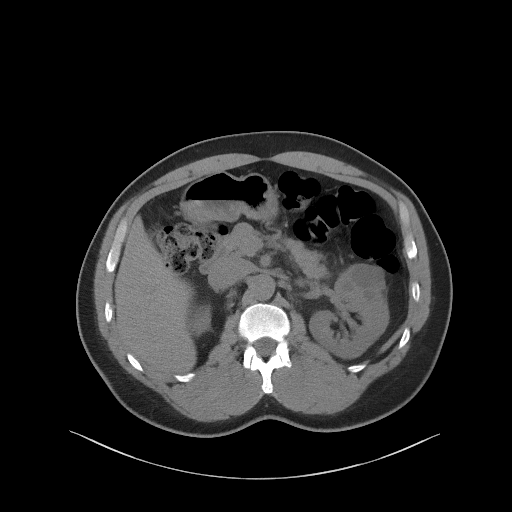
[im 73/104  bone]
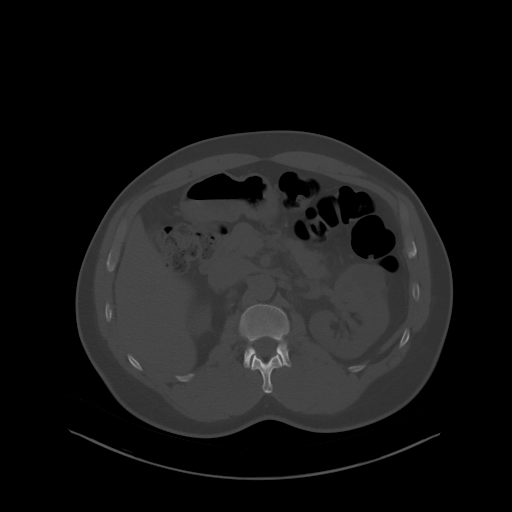
[im 82/104  soft-tissue]
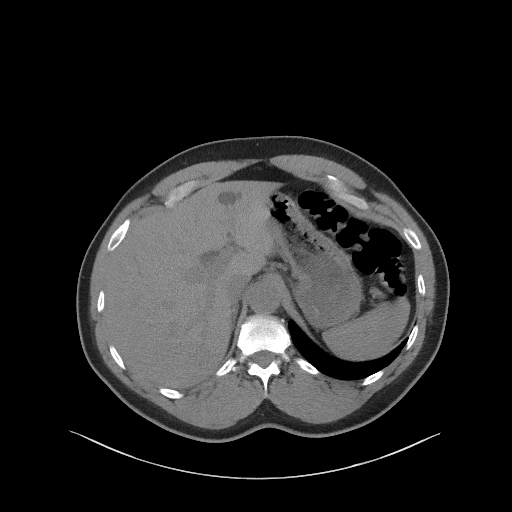
[im 91/104  soft-tissue]
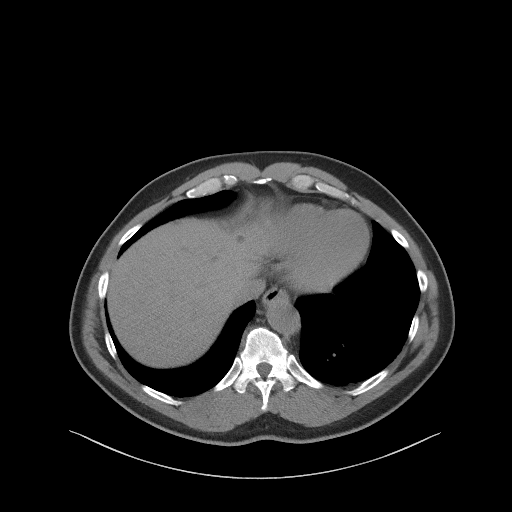
[im 99/104  soft-tissue]
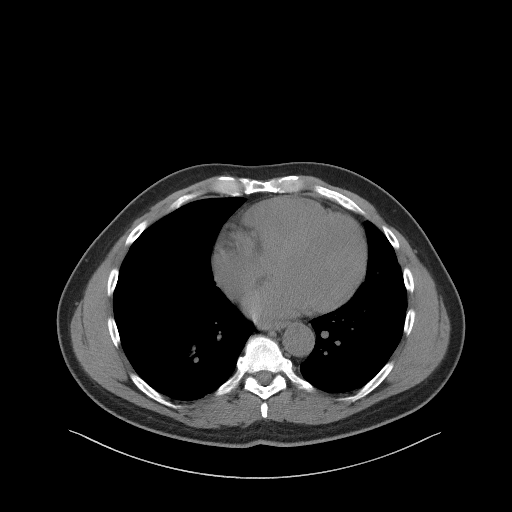

[Series 5: coronal st · coronal · 1.04mm/px · 3 of 90 slices shown]
[im 30/90  soft-tissue]
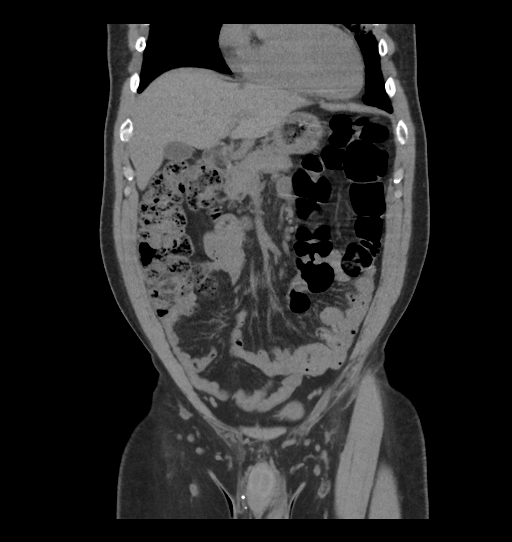
[im 40/90  soft-tissue]
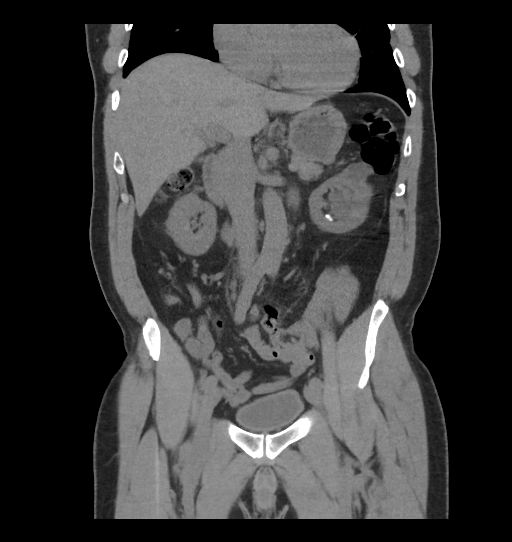
[im 50/90  soft-tissue]
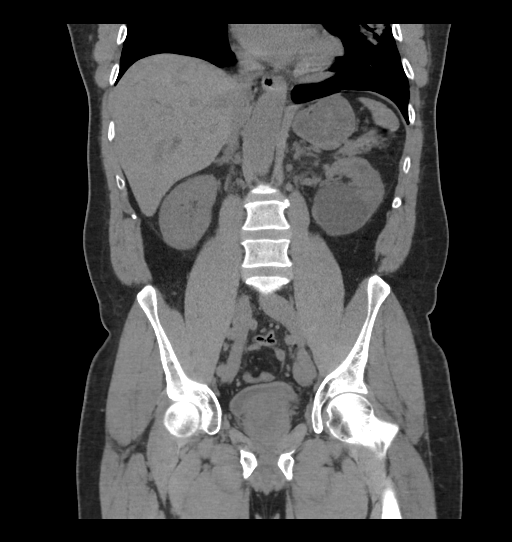

[15 of 46 positions shown; findings below may reference images not displayed]

FINDINGS: Lower chest: There is airspace consolidation in the inferior lingula
and a portion of the anterior segment of the left lower lobe.

Hepatobiliary: There are scattered small cysts throughout the liver.
Largest cyst is located in the medial segment of the left lobe of
the liver measuring 1.4 x 1.0 cm. No non cystic appearing liver
lesions are evident on this noncontrast enhanced study. Gallbladder
wall is not appreciably thickened. There is no biliary duct
dilatation.

Pancreas: There is no pancreatic mass or inflammatory focus.

Spleen: No splenic lesions are evident.

Adrenals/Urinary Tract: Adrenals appear normal bilaterally. There
are multiple cysts throughout the left kidney with the largest cyst
in the posterior parapelvic region measuring 6.2 x 5.7 cm. There is
a small amount of calcification in the periphery of this cyst. Other
left renal cysts appear simple in nature. There is a 9 x 7 mm cyst
arising from the mid right kidney. There is no appreciable
hydronephrosis on either side. There is a calculus in the lower pole
of the left kidney anteriorly measuring 7 x 4 mm with adjacent
calculus measuring 6 x 2 mm in this area. There are no ureteral
calculi on either side. The urinary bladder is midline with wall
thickness within normal limits.

Stomach/Bowel: There are scattered sigmoid diverticula without
diverticulitis. There is no bowel wall or mesenteric thickening. No
bowel obstruction. No free air or portal venous air.

Vascular/Lymphatic: There is slight calcification in the abdominal
aorta. There is no abdominal aortic aneurysm. No vascular lesions
are evident on this noncontrast enhanced study. There is no
adenopathy by size criteria in the abdomen or pelvis. There are,
however, multiple subcentimeter. Prostatic pelvic lymph nodes.

Reproductive: Prostate appears enlarged with impression on the
inferior urinary bladder. Seminal vesicles appear unremarkable. No
pelvic mass is apparent.

Other: Appendix appears normal. No abscess or ascites is evident in
the abdomen or pelvis. There is a minimal ventral hernia containing
only fat.

Musculoskeletal: There is degenerative change in the lower lumbar
spine. There are no blastic or lytic bone lesions. No intramuscular
or abdominal wall lesions.
IMPRESSION: Airspace consolidation consistent with pneumonia in the inferior
lingula and anterior segment left lower lobe regions.

There are multiple renal cysts as well as liver cysts. Suspect a
degree of old all polycystic kidney disease. Note that the largest
cyst on the left contains a small amount of peripheral
calcification, a finding that may be seen with polycystic kidney
disease. There are nonobstructing calculi in the anterior lower pole
left kidney. No hydronephrosis on either side. No ureteral calculi.

Enlarged prostate with several small periprostatic lymph nodes.
Advise correlation with PSA given these findings.

By size criteria, there is no frank adenopathy in the abdomen or
pelvis.

No bowel obstruction. No abscess. Appendix appears normal. There is
a minimal ventral hernia containing only fat.

## 2017-04-03 ENCOUNTER — Telehealth: Payer: Self-pay | Admitting: Emergency Medicine

## 2017-04-03 NOTE — Telephone Encounter (Signed)
LMOVM advising patient he is due for an CPE. Overdue for HTN appointment. Last OV 04/16/2016.

## 2017-04-30 ENCOUNTER — Encounter: Payer: Self-pay | Admitting: Emergency Medicine

## 2017-12-24 ENCOUNTER — Encounter: Payer: Self-pay | Admitting: Emergency Medicine

## 2019-12-03 ENCOUNTER — Other Ambulatory Visit: Payer: Self-pay

## 2019-12-03 ENCOUNTER — Emergency Department (HOSPITAL_BASED_OUTPATIENT_CLINIC_OR_DEPARTMENT_OTHER)
Admission: EM | Admit: 2019-12-03 | Discharge: 2019-12-03 | Disposition: A | Discharge status: Home / Self Care | Payer: Self-pay | Attending: Emergency Medicine | Admitting: Emergency Medicine

## 2019-12-03 ENCOUNTER — Emergency Department (HOSPITAL_BASED_OUTPATIENT_CLINIC_OR_DEPARTMENT_OTHER): Payer: Self-pay

## 2019-12-03 ENCOUNTER — Encounter (HOSPITAL_BASED_OUTPATIENT_CLINIC_OR_DEPARTMENT_OTHER): Payer: Self-pay

## 2019-12-03 DIAGNOSIS — M25561 Pain in right knee: Secondary | ICD-10-CM | POA: Insufficient documentation

## 2019-12-03 DIAGNOSIS — I10 Essential (primary) hypertension: Secondary | ICD-10-CM | POA: Insufficient documentation

## 2019-12-03 DIAGNOSIS — M79672 Pain in left foot: Secondary | ICD-10-CM | POA: Insufficient documentation

## 2019-12-03 DIAGNOSIS — M5416 Radiculopathy, lumbar region: Secondary | ICD-10-CM | POA: Insufficient documentation

## 2019-12-03 DIAGNOSIS — R2242 Localized swelling, mass and lump, left lower limb: Secondary | ICD-10-CM | POA: Insufficient documentation

## 2019-12-03 MED ORDER — KETOROLAC TROMETHAMINE 30 MG/ML IJ SOLN
30.0000 mg | Freq: Once | INTRAMUSCULAR | Status: AC
  Administered 2019-12-03: 30 mg via INTRAMUSCULAR
  Filled 2019-12-03: qty 1

## 2019-12-03 MED ORDER — CELECOXIB 200 MG PO CAPS: 200.0000 mg | ORAL_CAPSULE | Freq: Two times a day (BID) | ORAL | 0 refills | Status: AC

## 2019-12-03 NOTE — ED Triage Notes (Signed)
Pt reports he has also had some swelling in his legs, states that his PCP told him it may be related to some of his medications. Pt also reports pain causes him to be off balance.

## 2019-12-03 NOTE — ED Triage Notes (Signed)
Pt states that he has been having pain to his left leg going from his hip into his knee and causing tingling in his left foot since 2019. Reports pain in right knee since yesterday.

## 2019-12-03 NOTE — ED Provider Notes (Signed)
[facility] EMERGENCY DEPARTMENT Provider Note   CSN: [id number] Arrival date & time: 12/03/19  1058     History Chief Complaint  Patient presents with  . Knee Pain    Justin Bates is a 59 y.o. male.  Pt presents to the ED today with right knee pain, left foot pain, and left leg swelling.  He also has numbness radiating down his left leg.  Pt said symptoms have been going on for several years.  He worked for 25 years and had these problems.  He retired and sx improved.  He has gone back to work and now sx have returned.  He called into work today because his right knee was swollen and painful.  Pt did see his pcp about ankle swelling.  His norvasc was decreased and spirinolactone was added.  He's been taking that and it has helped a little.  Pt denies any new trauma.        Past Medical History:  Diagnosis Date  . History of chicken pox   . Hypertension   . Pneumonia     Patient Active Problem List   Diagnosis Date Noted  . Essential hypertension 12/22/2015  . Polycystic kidney 12/22/2015  . Renal calculus 12/22/2015  . CAP (community acquired pneumonia) 12/22/2015  . Prostate enlargement 12/22/2015    Past Surgical History:  Procedure Laterality Date  . LIPOMA EXCISION Right 12/21/2014   Procedure: EXCISION RIGHT SHOULDER  LIPOMA;  Surgeon: Erroll Luna, MD;  Location: [facility];  Service: General;  Laterality: Right;  . TONSILLECTOMY     as child       Family History  Problem Relation Age of Onset  . Hypertension Mother        Living  . Stroke Mother   . Hypertension Father        Living  . Stroke Father   . Healthy Brother        x4  . Healthy Sister        x5  . Healthy Son        x1  . Healthy Daughter        x2    Social History   Tobacco Use  . Smoking status: Never Smoker  . Smokeless tobacco: Never Used  Substance Use Topics  . Alcohol use: No    Alcohol/week: 0.0 standard drinks  . Drug use: No     Home Medications Prior to Admission medications   Medication Sig Start Date End Date Taking? Authorizing Provider  alfuzosin (UROXATRAL) 10 MG 24 hr tablet Take 10 mg by mouth daily with breakfast.   Yes [provider]  amLODipine (NORVASC) 10 MG tablet Take 2.5 mg by mouth daily.    Yes [provider]  calcium-vitamin D (OSCAL WITH D) 500-200 MG-UNIT tablet Take 1 tablet by mouth daily with breakfast.   Yes [provider]  spironolactone (ALDACTONE) 50 MG tablet Take 50 mg by mouth once.    Yes [provider]  celecoxib (CELEBREX) 200 MG capsule Take 1 capsule (200 mg total) by mouth 2 (two) times daily. 12/03/19   [name], MD    Allergies    Patient has no known allergies.  Review of Systems   Review of Systems  Musculoskeletal:       Right knee, left foot pain/swelling  Neurological: Positive for numbness.  All other systems reviewed and are negative.   Physical Exam Updated Vital Signs BP (!) 141/86 (  BP Location: Left Arm)   Pulse 84   Temp 98 F (36.7 C) (Oral)   Resp 18   Ht 6\' 6"  (1.981 m)   Wt 117.9 kg   SpO2 98%   BMI 30.05 kg/m   Physical Exam Vitals and nursing note reviewed.  Constitutional:      Appearance: Normal appearance.  HENT:     Head: Normocephalic and atraumatic.     Right Ear: External ear normal.     Left Ear: External ear normal.     Nose: Nose normal.     Mouth/Throat:     Mouth: Mucous membranes are moist.     Pharynx: Oropharynx is clear.  Eyes:     Extraocular Movements: Extraocular movements intact.     Conjunctiva/sclera: Conjunctivae normal.     Pupils: Pupils are equal, round, and reactive to light.  Cardiovascular:     Rate and Rhythm: Normal rate and regular rhythm.     Pulses: Normal pulses.     Heart sounds: Normal heart sounds.  Pulmonary:     Effort: Pulmonary effort is normal.     Breath sounds: Normal breath sounds.  Abdominal:     General: Abdomen is flat. Bowel  sounds are normal.     Palpations: Abdomen is soft.  Musculoskeletal:        General: Normal range of motion.     Cervical back: Normal range of motion and neck supple.  Skin:    General: Skin is warm.     Capillary Refill: Capillary refill takes less than 2 seconds.  Neurological:     General: No focal deficit present.     Mental Status: He is alert and oriented to person, place, and time.  Psychiatric:        Mood and Affect: Mood normal.        Behavior: Behavior normal.        Thought Content: Thought content normal.        Judgment: Judgment normal.     ED Results / Procedures / Treatments   Labs (all labs ordered are listed, but only abnormal results are displayed) Labs Reviewed - No data to display  EKG None  Radiology DG Lumbar Spine Complete  Result Date: 12/03/2019 CLINICAL DATA:  Pain. Additional history provided: Patient reports pain radiating to left leg going from hip to knee, causing tingling in foot since 2019. Patient also reports pain in right knee yesterday. EXAM: LUMBAR SPINE - COMPLETE 4+ VIEW COMPARISON:  CT abdomen/pelvis 12/17/2015 FINDINGS: Five lumbar vertebrae. 2 mm L4-L5 grade 1 anterolisthesis. Vertebral body height is maintained. No definite pars interarticularis defect is identified on oblique radiographs. Mild disc space narrowing at L4-L5 and L5-S1. Facet arthrosis within the lower lumbar spine, greatest at L5-S1. IMPRESSION: No evidence of lumbar compression fracture. Lumbar spondylosis as described. 2 mm L4-L5 grade 1 anterolisthesis. Electronically Signed   By: Kellie Simmering DO   On: 12/03/2019 12:59   US Venous Img Lower Bilateral  Result Date: 12/03/2019 CLINICAL DATA:  Lower extremity edema and left lower extremity pain. History of large right popliteal fossa Baker's cyst. EXAM: BILATERAL LOWER EXTREMITY VENOUS DOPPLER ULTRASOUND TECHNIQUE: Gray-scale sonography with graded compression, as well as color Doppler and duplex ultrasound were  performed to evaluate the lower extremity deep venous systems from the level of the common femoral vein and including the common femoral, femoral, profunda femoral, popliteal and calf veins including the posterior tibial, peroneal and gastrocnemius veins when visible. The superficial  great saphenous vein was also interrogated. Spectral Doppler was utilized to evaluate flow at rest and with distal augmentation maneuvers in the common femoral, femoral and popliteal veins. COMPARISON:  Right lower extremity venous duplex study on 12/17/2016 FINDINGS: RIGHT LOWER EXTREMITY Common Femoral Vein: No evidence of thrombus. Normal compressibility, respiratory phasicity and response to augmentation. Saphenofemoral Junction: No evidence of thrombus. Normal compressibility and flow on color Doppler imaging. Profunda Femoral Vein: No evidence of thrombus. Normal compressibility and flow on color Doppler imaging. Femoral Vein: No evidence of thrombus. Normal compressibility, respiratory phasicity and response to augmentation. Popliteal Vein: No evidence of thrombus. Normal compressibility, respiratory phasicity and response to augmentation. Calf Veins: No evidence of thrombus. Normal compressibility and flow on color Doppler imaging. Superficial Great Saphenous Vein: No evidence of thrombus. Normal compressibility. Venous Reflux:  None. Other Findings: No evidence of superficial thrombophlebitis. No further discrete popliteal fossa fluid collection is identified. There is some ill-defined subcutaneous edema in the popliteal fossa extending into the right calf which may reflect prior rupture/dissection the previously imaged large Baker's cyst. LEFT LOWER EXTREMITY Common Femoral Vein: No evidence of thrombus. Normal compressibility, respiratory phasicity and response to augmentation. Saphenofemoral Junction: No evidence of thrombus. Normal compressibility and flow on color Doppler imaging. Profunda Femoral Vein: No evidence of  thrombus. Normal compressibility and flow on color Doppler imaging. Femoral Vein: No evidence of thrombus. Normal compressibility, respiratory phasicity and response to augmentation. Popliteal Vein: No evidence of thrombus. Normal compressibility, respiratory phasicity and response to augmentation. Calf Veins: No evidence of thrombus. Normal compressibility and flow on color Doppler imaging. Superficial Great Saphenous Vein: No evidence of thrombus. Normal compressibility. Venous Reflux:  None. Other Findings: No evidence of superficial thrombophlebitis or abnormal fluid collection. IMPRESSION: 1. No evidence of deep venous thrombosis in either lower extremity. 2. No further discrete right popliteal fossa Baker's cyst is identified. Ill-defined subcutaneous edema in the popliteal fossa extending into the right calf may reflect prior rupture/dissection of the previously identified Baker's cyst. Electronically Signed   By: Aletta Edouard M.D.   On: 12/03/2019 12:27   DG Knee Complete 4 Views Right  Result Date: 12/03/2019 CLINICAL DATA:  Pt states that he has been having pain to his left leg going from his hip into his knee and causing tingling in his left foot since 2019. Reports pain in right knee since yesterday. EXAM: RIGHT KNEE - COMPLETE 4+ VIEW COMPARISON:  None. FINDINGS: No evidence of fracture or dislocation. Tiny knee joint effusion. No evidence of arthropathy or other focal bone abnormality. Soft tissues are unremarkable. IMPRESSION: Tiny right knee joint effusion without acute osseous abnormality. Electronically Signed   By: Audie Pinto M.D.   On: 12/03/2019 12:42   DG Foot Complete Left  Result Date: 12/03/2019 CLINICAL DATA:  Left foot pain and tingling radiating into the legs since 2019. No known injury. EXAM: LEFT FOOT - COMPLETE 3+ VIEW COMPARISON:  None. FINDINGS: There is no evidence of fracture or dislocation. Small exostosis off the dorsal neck of the talus is noted. There is no  evidence of arthropathy or other focal bone abnormality. Soft tissues are unremarkable. IMPRESSION: Negative exam. Electronically Signed   By: Inge Rise M.D.   On: 12/03/2019 12:57    Procedures Procedures (including critical care time)  Medications Ordered in ED Medications  ketorolac (TORADOL) 30 MG/ML injection 30 mg (has no administration in time range)    ED Course  I have reviewed the triage vital signs and the  nursing notes.  Pertinent labs & imaging results that were available during my care of the patient were reviewed by me and considered in my medical decision making (see chart for details).    MDM Rules/Calculators/A&P                      Intermittent swelling is likely due to arthritis.  Pt does also have some disc space narrowing in his lumbar spine which is likely why he has some numbness going down his leg.  Pt will be started on celebrex.  Return if worse.  F/u with pcp.  Final Clinical Impression(s) / ED Diagnoses Final diagnoses:  Acute pain of right knee  Foot pain, left  Lumbar radiculopathy    Rx / DC Orders ED Discharge Orders         Ordered    celecoxib (CELEBREX) 200 MG capsule  2 times daily     12/03/19 [place], Keshawn Sundberg, MD 12/03/19 1317

## 2019-12-03 NOTE — ED Notes (Signed)
ED Provider at bedside. 

## 2020-02-15 ENCOUNTER — Emergency Department (HOSPITAL_BASED_OUTPATIENT_CLINIC_OR_DEPARTMENT_OTHER)
Admission: EM | Admit: 2020-02-15 | Discharge: 2020-02-15 | Disposition: A | Discharge status: Home / Self Care | Payer: BC Managed Care – PPO | Attending: Emergency Medicine | Admitting: Emergency Medicine

## 2020-02-15 ENCOUNTER — Other Ambulatory Visit: Payer: Self-pay

## 2020-02-15 ENCOUNTER — Encounter (HOSPITAL_BASED_OUTPATIENT_CLINIC_OR_DEPARTMENT_OTHER): Payer: Self-pay | Admitting: Emergency Medicine

## 2020-02-15 DIAGNOSIS — R1030 Lower abdominal pain, unspecified: Secondary | ICD-10-CM | POA: Insufficient documentation

## 2020-02-15 DIAGNOSIS — Z5321 Procedure and treatment not carried out due to patient leaving prior to being seen by health care provider: Secondary | ICD-10-CM | POA: Insufficient documentation

## 2020-02-15 NOTE — ED Notes (Signed)
Registration clerk reported that pt was leaving

## 2020-02-15 NOTE — ED Triage Notes (Signed)
Pt states he has been on medication for his prostate and has not been able to urinate since yesterday  Pt states he is having some lower abd pain

## 2020-05-30 ENCOUNTER — Other Ambulatory Visit: Payer: Self-pay | Admitting: Urology

## 2020-05-31 ENCOUNTER — Other Ambulatory Visit: Payer: Self-pay | Admitting: Urology

## 2020-06-28 ENCOUNTER — Encounter (HOSPITAL_COMMUNITY): Payer: Self-pay

## 2020-06-28 NOTE — Progress Notes (Addendum)
COVID Vaccine Completed:x2 Date COVID Vaccine completed:  September 2021 COVID vaccine manufacturer: [employer]   PCP - Crista Elliot, PA-C Cardiologist -   Chest x-ray -  EKG - 06-29-20 in Epic Stress Test -  ECHO - 04-20-2003 in Epic Cardiac Cath -  Pacemaker/ICD device last checked:  Sleep Study -  CPAP -   Fasting Blood Sugar -  Checks Blood Sugar - does not check, on no meds  Blood Thinner Instructions: Aspirin Instructions: Last Dose:  Anesthesia review:   Patient denies shortness of breath, fever, cough and chest pain at PAT appointment.  Patient able to climb a flight of stairs and do ADL's without assistance.   Patient verbalized understanding of instructions that were given to them at the PAT appointment. Patient was also instructed that they will need to review over the PAT instructions again at home before surgery.

## 2020-06-28 NOTE — Patient Instructions (Addendum)
DUE TO COVID-19 ONLY ONE VISITOR IS ALLOWED TO COME WITH YOU AND STAY IN THE WAITING ROOM ONLY DURING PRE OP AND PROCEDURE.   IF YOU WILL BE ADMITTED INTO THE HOSPITAL YOU ARE ALLOWED ONE SUPPORT PERSON DURING VISITATION HOURS ONLY (10AM -8PM)   . The support person may change daily. . The support person must pass our screening, gel in and out, and wear a mask at all times, including in the patient's room. . Patients must also wear a mask when staff or their support person are in the room.   COVID SWAB TESTING MUST BE COMPLETED ON:   Friday, 07-07-20 @ 9:35 AM   [address] [place], [place] [address]  (Must self quarantine after testing. Follow instructions on handout.)        Your procedure is scheduled on:  Monday, 07-10-20   Report to [facility] Main  Entrance    Report to admitting at 10:00 AM   Call this number if you have problems the morning of surgery [phone]   Do not eat food :After Midnight.   May have liquids until 9:00 AM  day of surgery  CLEAR LIQUID DIET  Foods Allowed                                                                     Foods Excluded  Water, Black Coffee and tea, regular and decaf              liquids that you cannot  Plain Jell-O in any flavor  (No red)                                     see through such as: Fruit ices (not with fruit pulp)                                      milk, soups, orange juice              Iced Popsicles (No red)                                      All solid food                                   Apple juices Sports drinks like Gatorade (No red) Lightly seasoned clear broth or consume(fat free) Sugar, honey syrup    Oral Hygiene is also important to reduce your risk of infection.                                    Remember - BRUSH YOUR TEETH THE MORNING OF SURGERY WITH YOUR REGULAR TOOTHPASTE   Do NOT smoke after Midnight   Take these medicines the morning of surgery with A SIP OF WATER:   Amlodipine  DO NOT TAKE ANY ORAL DIABETIC  MEDICATIONS DAY OF YOUR SURGERY                               You may not have any metal on your body including hair pins and body piercings             Do not wear lotions, powders, perfumes/cologne, or deodorant             Men may shave face and neck.   Do not bring valuables to the hospital. Justin Bates.   Contacts, dentures or bridgework may not be worn into surgery.   Bring small overnight bag day of surgery.      Special Instructions: Bring a copy of your healthcare power of attorney and living will documents         the day of surgery if you haven't scanned them in before.              Please read over the following fact sheets you were given: IF YOU HAVE QUESTIONS ABOUT YOUR PRE OP INSTRUCTIONS PLEASE CALL [phone]   [facility] - Preparing for Surgery Before surgery, you can play an important role.  Because skin is not sterile, your skin needs to be as free of germs as possible.  You can reduce the number of germs on your skin by washing with CHG (chlorahexidine gluconate) soap before surgery.  CHG is an antiseptic cleaner which kills germs and bonds with the skin to continue killing germs even after washing. Please DO NOT use if you have an allergy to CHG or antibacterial soaps.  If your skin becomes reddened/irritated stop using the CHG and inform your nurse when you arrive at Short Stay. Do not shave (including legs and underarms) for at least 48 hours prior to the first CHG shower.  You may shave your face/neck.  Please follow these instructions carefully:  1.  Shower with CHG Soap the night before surgery and the  morning of surgery.  2.  If you choose to wash your hair, wash your hair first as usual with your normal  shampoo.  3.  After you shampoo, rinse your hair and body thoroughly to remove the shampoo.                             4.  Use CHG as you would any other liquid soap.  You can  apply chg directly to the skin and wash.  Gently with a scrungie or clean washcloth.  5.  Apply the CHG Soap to your body ONLY FROM THE NECK DOWN.   Do   not use on face/ open                           Wound or open sores. Avoid contact with eyes, ears mouth and   genitals (private parts).                       Wash face,  Genitals (private parts) with your normal soap.             6.  Wash thoroughly, paying special attention to the area where your    surgery  will be performed.  7.  Thoroughly rinse your body with warm water from the neck down.  8.  DO NOT shower/wash with your normal soap after using and rinsing off the CHG Soap.                9.  Pat yourself dry with a clean towel.            10.  Wear clean pajamas.            11.  Place clean sheets on your bed the night of your first shower and do not  sleep with pets. Day of Surgery : Do not apply any lotions/deodorants the morning of surgery.  Please wear clean clothes to the hospital/surgery center.  FAILURE TO FOLLOW THESE INSTRUCTIONS MAY RESULT IN THE CANCELLATION OF YOUR SURGERY  PATIENT SIGNATURE_________________________________  NURSE SIGNATURE__________________________________  ________________________________________________________________________

## 2020-06-29 ENCOUNTER — Encounter (HOSPITAL_COMMUNITY)
Admission: RE | Admit: 2020-06-29 | Discharge: 2020-06-29 | Disposition: A | Discharge status: Home / Self Care | Payer: BC Managed Care – PPO | Source: Ambulatory Visit | Attending: Urology | Admitting: Urology

## 2020-06-29 ENCOUNTER — Other Ambulatory Visit: Payer: Self-pay

## 2020-06-29 ENCOUNTER — Encounter (HOSPITAL_COMMUNITY): Payer: Self-pay

## 2020-06-29 DIAGNOSIS — I10 Essential (primary) hypertension: Secondary | ICD-10-CM | POA: Diagnosis not present

## 2020-06-29 DIAGNOSIS — Z01818 Encounter for other preprocedural examination: Secondary | ICD-10-CM | POA: Diagnosis present

## 2020-06-29 HISTORY — DX: Unspecified osteoarthritis, unspecified site: M19.90

## 2020-06-29 HISTORY — DX: Type 2 diabetes mellitus without complications: E11.9

## 2020-06-29 HISTORY — DX: Anxiety disorder, unspecified: F41.9

## 2020-06-29 HISTORY — DX: Gastro-esophageal reflux disease without esophagitis: K21.9

## 2020-06-29 LAB — BASIC METABOLIC PANEL
Anion gap: 10 (ref 5–15)
BUN: 19 mg/dL (ref 8–23)
CO2: 27 mmol/L (ref 22–32)
Calcium: 9.3 mg/dL (ref 8.9–10.3)
Chloride: 104 mmol/L (ref 98–111)
Creatinine, Ser: 1.31 mg/dL — ABNORMAL HIGH (ref 0.61–1.24)
GFR, Estimated: >60 mL/min (ref >60)
Glucose, Bld: 120 mg/dL — ABNORMAL HIGH (ref 70–99)
Potassium: 4.1 mmol/L (ref 3.5–5.1)
Sodium: 141 mmol/L (ref 135–145)

## 2020-06-29 LAB — CBC
HCT: 38.6 % — ABNORMAL LOW (ref 39.0–52.0)
Hemoglobin: 12.5 g/dL — ABNORMAL LOW (ref 13.0–17.0)
MCH: 30.3 pg (ref 26.0–34.0)
MCHC: 32.4 g/dL (ref 30.0–36.0)
MCV: 93.5 fL (ref 80.0–100.0)
Platelets: 218 10*3/uL (ref 150–400)
RBC: 4.13 MIL/uL — ABNORMAL LOW (ref 4.22–5.81)
RDW: 13.3 % (ref 11.5–15.5)
WBC: 4.7 10*3/uL (ref 4.0–10.5)
nRBC: 0 % (ref 0.0–0.2)

## 2020-06-29 LAB — GLUCOSE, CAPILLARY: Glucose-Capillary: 112 mg/dL — ABNORMAL HIGH (ref 70–99)

## 2020-06-29 LAB — HEMOGLOBIN A1C
Hgb A1c MFr Bld: 6.3 % — ABNORMAL HIGH (ref 4.8–5.6)
Mean Plasma Glucose: 134.11 mg/dL

## 2020-07-07 ENCOUNTER — Other Ambulatory Visit (HOSPITAL_COMMUNITY)
Admission: RE | Admit: 2020-07-07 | Discharge: 2020-07-07 | Disposition: A | Discharge status: Home / Self Care | Payer: BC Managed Care – PPO | Source: Ambulatory Visit | Attending: Urology | Admitting: Urology

## 2020-07-07 DIAGNOSIS — Z20822 Contact with and (suspected) exposure to covid-19: Secondary | ICD-10-CM | POA: Diagnosis not present

## 2020-07-07 DIAGNOSIS — Z01818 Encounter for other preprocedural examination: Secondary | ICD-10-CM | POA: Diagnosis present

## 2020-07-07 LAB — SARS CORONAVIRUS 2 (TAT 6-24 HRS): SARS Coronavirus 2: NEGATIVE

## 2020-07-09 MED ORDER — DEXTROSE 5 % IV SOLN
3.0000 g | Freq: Once | INTRAVENOUS | Status: AC
  Administered 2020-07-10: 3 g via INTRAVENOUS
  Filled 2020-07-09: qty 3

## 2020-07-10 ENCOUNTER — Ambulatory Visit (HOSPITAL_COMMUNITY): Payer: BC Managed Care – PPO | Admitting: Certified Registered Nurse Anesthetist

## 2020-07-10 ENCOUNTER — Encounter (HOSPITAL_COMMUNITY): Payer: Self-pay | Admitting: Urology

## 2020-07-10 ENCOUNTER — Encounter (HOSPITAL_COMMUNITY)
Admission: RE | Disposition: A | Discharge status: Home / Self Care | Payer: Self-pay | Source: Home / Self Care | Attending: Urology

## 2020-07-10 ENCOUNTER — Other Ambulatory Visit: Payer: Self-pay

## 2020-07-10 ENCOUNTER — Inpatient Hospital Stay (HOSPITAL_COMMUNITY)
Admission: RE | Admit: 2020-07-10 | Discharge: 2020-07-13 | DRG: 713 | Disposition: A | Discharge status: Home / Self Care | Payer: BC Managed Care – PPO | Attending: Urology | Admitting: Urology

## 2020-07-10 DIAGNOSIS — F419 Anxiety disorder, unspecified: Secondary | ICD-10-CM | POA: Diagnosis present

## 2020-07-10 DIAGNOSIS — D291 Benign neoplasm of prostate: Secondary | ICD-10-CM | POA: Diagnosis present

## 2020-07-10 DIAGNOSIS — K30 Functional dyspepsia: Secondary | ICD-10-CM | POA: Diagnosis not present

## 2020-07-10 DIAGNOSIS — I10 Essential (primary) hypertension: Secondary | ICD-10-CM | POA: Diagnosis present

## 2020-07-10 DIAGNOSIS — R710 Precipitous drop in hematocrit: Secondary | ICD-10-CM | POA: Diagnosis not present

## 2020-07-10 DIAGNOSIS — K219 Gastro-esophageal reflux disease without esophagitis: Secondary | ICD-10-CM | POA: Diagnosis present

## 2020-07-10 DIAGNOSIS — E119 Type 2 diabetes mellitus without complications: Secondary | ICD-10-CM | POA: Diagnosis present

## 2020-07-10 DIAGNOSIS — Z823 Family history of stroke: Secondary | ICD-10-CM

## 2020-07-10 DIAGNOSIS — Z888 Allergy status to other drugs, medicaments and biological substances status: Secondary | ICD-10-CM

## 2020-07-10 DIAGNOSIS — R338 Other retention of urine: Secondary | ICD-10-CM | POA: Diagnosis present

## 2020-07-10 DIAGNOSIS — N138 Other obstructive and reflux uropathy: Secondary | ICD-10-CM | POA: Diagnosis present

## 2020-07-10 DIAGNOSIS — N400 Enlarged prostate without lower urinary tract symptoms: Secondary | ICD-10-CM | POA: Diagnosis present

## 2020-07-10 DIAGNOSIS — R310 Gross hematuria: Principal | ICD-10-CM

## 2020-07-10 DIAGNOSIS — N39490 Overflow incontinence: Secondary | ICD-10-CM | POA: Diagnosis present

## 2020-07-10 DIAGNOSIS — Z8249 Family history of ischemic heart disease and other diseases of the circulatory system: Secondary | ICD-10-CM

## 2020-07-10 DIAGNOSIS — N401 Enlarged prostate with lower urinary tract symptoms: Principal | ICD-10-CM | POA: Diagnosis present

## 2020-07-10 HISTORY — PX: TRANSURETHRAL RESECTION OF PROSTATE: SHX73

## 2020-07-10 LAB — BASIC METABOLIC PANEL
Anion gap: 9 (ref 5–15)
BUN: 14 mg/dL (ref 8–23)
CO2: 24 mmol/L (ref 22–32)
Calcium: 7.9 mg/dL — ABNORMAL LOW (ref 8.9–10.3)
Chloride: 107 mmol/L (ref 98–111)
Creatinine, Ser: 1.32 mg/dL — ABNORMAL HIGH (ref 0.61–1.24)
GFR, Estimated: >60 mL/min (ref >60)
Glucose, Bld: 178 mg/dL — ABNORMAL HIGH (ref 70–99)
Potassium: 4.1 mmol/L (ref 3.5–5.1)
Sodium: 140 mmol/L (ref 135–145)

## 2020-07-10 LAB — CBC WITH DIFFERENTIAL/PLATELET
Abs Immature Granulocytes: 0.06 10*3/uL (ref 0.00–0.07)
Basophils Absolute: 0 10*3/uL (ref 0.0–0.1)
Basophils Relative: 0 %
Eosinophils Absolute: 0.1 10*3/uL (ref 0.0–0.5)
Eosinophils Relative: 1 %
HCT: 32.2 % — ABNORMAL LOW (ref 39.0–52.0)
Hemoglobin: 10 g/dL — ABNORMAL LOW (ref 13.0–17.0)
Immature Granulocytes: 0 %
Lymphocytes Relative: 11 %
Lymphs Abs: 1.6 10*3/uL (ref 0.7–4.0)
MCH: 30.2 pg (ref 26.0–34.0)
MCHC: 31.1 g/dL (ref 30.0–36.0)
MCV: 97.3 fL (ref 80.0–100.0)
Monocytes Absolute: 0.5 10*3/uL (ref 0.1–1.0)
Monocytes Relative: 3 %
Neutro Abs: 11.7 10*3/uL — ABNORMAL HIGH (ref 1.7–7.7)
Neutrophils Relative %: 85 %
Platelets: 167 10*3/uL (ref 150–400)
RBC: 3.31 MIL/uL — ABNORMAL LOW (ref 4.22–5.81)
RDW: 13.7 % (ref 11.5–15.5)
WBC: 13.9 10*3/uL — ABNORMAL HIGH (ref 4.0–10.5)
nRBC: 0 % (ref 0.0–0.2)

## 2020-07-10 LAB — HEMOGLOBIN AND HEMATOCRIT, BLOOD
HCT: 28.7 % — ABNORMAL LOW (ref 39.0–52.0)
Hemoglobin: 8.9 g/dL — ABNORMAL LOW (ref 13.0–17.0)

## 2020-07-10 LAB — GLUCOSE, CAPILLARY
Glucose-Capillary: 111 mg/dL — ABNORMAL HIGH (ref 70–99)
Glucose-Capillary: 199 mg/dL — ABNORMAL HIGH (ref 70–99)

## 2020-07-10 SURGERY — TURP (TRANSURETHRAL RESECTION OF PROSTATE)
Anesthesia: General | Site: Prostate

## 2020-07-10 MED ORDER — PHENYLEPHRINE 40 MCG/ML (10ML) SYRINGE FOR IV PUSH (FOR BLOOD PRESSURE SUPPORT)
PREFILLED_SYRINGE | INTRAVENOUS | Status: DC | PRN
  Administered 2020-07-10: 120 ug via INTRAVENOUS
  Administered 2020-07-10: 160 ug via INTRAVENOUS
  Administered 2020-07-10: 120 ug via INTRAVENOUS
  Administered 2020-07-10: 160 ug via INTRAVENOUS
  Administered 2020-07-10 (×2): 120 ug via INTRAVENOUS

## 2020-07-10 MED ORDER — PROPOFOL 10 MG/ML IV BOLUS
INTRAVENOUS | Status: AC
  Filled 2020-07-10: qty 20

## 2020-07-10 MED ORDER — ROCURONIUM BROMIDE 10 MG/ML (PF) SYRINGE
PREFILLED_SYRINGE | INTRAVENOUS | Status: AC
  Filled 2020-07-10: qty 10

## 2020-07-10 MED ORDER — FENTANYL CITRATE (PF) 250 MCG/5ML IJ SOLN
INTRAMUSCULAR | Status: AC
  Filled 2020-07-10: qty 5

## 2020-07-10 MED ORDER — ALFUZOSIN HCL ER 10 MG PO TB24
10.0000 mg | ORAL_TABLET | Freq: Every day | ORAL | Status: DC
  Administered 2020-07-11 – 2020-07-13 (×3): 10 mg via ORAL
  Filled 2020-07-10 (×5): qty 1

## 2020-07-10 MED ORDER — LIDOCAINE HCL (CARDIAC) PF 100 MG/5ML IV SOSY
PREFILLED_SYRINGE | INTRAVENOUS | Status: DC | PRN
  Administered 2020-07-10: 100 mg via INTRAVENOUS

## 2020-07-10 MED ORDER — DOCUSATE SODIUM 100 MG PO CAPS: 100.0000 mg | ORAL_CAPSULE | Freq: Every day | ORAL | 0 refills | Status: AC | PRN

## 2020-07-10 MED ORDER — ALBUMIN HUMAN 5 % IV SOLN: INTRAVENOUS | Status: DC | PRN

## 2020-07-10 MED ORDER — LIDOCAINE HCL (PF) 2 % IJ SOLN
INTRAMUSCULAR | Status: AC
  Filled 2020-07-10: qty 5

## 2020-07-10 MED ORDER — AMLODIPINE BESYLATE 5 MG PO TABS
5.0000 mg | ORAL_TABLET | Freq: Once | ORAL | Status: AC
  Administered 2020-07-10: 5 mg via ORAL
  Filled 2020-07-10: qty 1

## 2020-07-10 MED ORDER — SODIUM CHLORIDE 0.9 % IR SOLN
Status: DC | PRN
  Administered 2020-07-10 (×5): 3000 mL via INTRAVESICAL
  Administered 2020-07-10: 1000 mL via INTRAVESICAL
  Administered 2020-07-10: 3000 mL via INTRAVESICAL
  Administered 2020-07-10: 1000 mL via INTRAVESICAL
  Administered 2020-07-10 (×10): 3000 mL via INTRAVESICAL
  Administered 2020-07-10: 1000 mL via INTRAVESICAL
  Administered 2020-07-10 (×8): 3000 mL via INTRAVESICAL

## 2020-07-10 MED ORDER — LABETALOL HCL 5 MG/ML IV SOLN
INTRAVENOUS | Status: AC
  Filled 2020-07-10: qty 4

## 2020-07-10 MED ORDER — MIDAZOLAM HCL 5 MG/5ML IJ SOLN
INTRAMUSCULAR | Status: DC | PRN
  Administered 2020-07-10: 2 mg via INTRAVENOUS

## 2020-07-10 MED ORDER — SODIUM CHLORIDE 0.9 % IR SOLN
3000.0000 mL | Status: DC
  Administered 2020-07-10 – 2020-07-12 (×11): 3000 mL

## 2020-07-10 MED ORDER — SUGAMMADEX SODIUM 500 MG/5ML IV SOLN
INTRAVENOUS | Status: DC | PRN
  Administered 2020-07-10: 250 mg via INTRAVENOUS

## 2020-07-10 MED ORDER — PHENYLEPHRINE 40 MCG/ML (10ML) SYRINGE FOR IV PUSH (FOR BLOOD PRESSURE SUPPORT)
PREFILLED_SYRINGE | INTRAVENOUS | Status: AC
  Filled 2020-07-10: qty 10

## 2020-07-10 MED ORDER — TRANEXAMIC ACID-NACL 1000-0.7 MG/100ML-% IV SOLN
1000.0000 mg | Freq: Once | INTRAVENOUS | Status: AC
  Administered 2020-07-10: 1000 mg via INTRAVENOUS
  Filled 2020-07-10: qty 100

## 2020-07-10 MED ORDER — SODIUM CHLORIDE 0.9% FLUSH: 3.0000 mL | INTRAVENOUS | Status: DC | PRN

## 2020-07-10 MED ORDER — HEPARIN SODIUM (PORCINE) 5000 UNIT/ML IJ SOLN
5000.0000 [IU] | Freq: Three times a day (TID) | INTRAMUSCULAR | Status: DC
  Administered 2020-07-10 – 2020-07-12 (×6): 5000 [IU] via SUBCUTANEOUS
  Filled 2020-07-10 (×6): qty 1

## 2020-07-10 MED ORDER — ORAL CARE MOUTH RINSE: 15.0000 mL | Freq: Once | OROMUCOSAL | Status: AC

## 2020-07-10 MED ORDER — OXYBUTYNIN CHLORIDE ER 10 MG PO TB24
10.0000 mg | ORAL_TABLET | Freq: Every day | ORAL | Status: DC
  Administered 2020-07-10 – 2020-07-13 (×4): 10 mg via ORAL
  Filled 2020-07-10 (×4): qty 1

## 2020-07-10 MED ORDER — ONDANSETRON HCL 4 MG/2ML IJ SOLN
INTRAMUSCULAR | Status: DC | PRN
  Administered 2020-07-10: 4 mg via INTRAVENOUS

## 2020-07-10 MED ORDER — HYDROMORPHONE HCL 2 MG/ML IJ SOLN
INTRAMUSCULAR | Status: AC
  Filled 2020-07-10: qty 1

## 2020-07-10 MED ORDER — ACETAMINOPHEN 325 MG PO TABS: 650.0000 mg | ORAL_TABLET | ORAL | Status: DC | PRN

## 2020-07-10 MED ORDER — SODIUM CHLORIDE 0.9 % IV SOLN: 250.0000 mL | INTRAVENOUS | Status: DC | PRN

## 2020-07-10 MED ORDER — FENTANYL CITRATE (PF) 100 MCG/2ML IJ SOLN
INTRAMUSCULAR | Status: DC | PRN
  Administered 2020-07-10 (×5): 50 ug via INTRAVENOUS

## 2020-07-10 MED ORDER — LACTATED RINGERS IV SOLN: INTRAVENOUS | Status: DC

## 2020-07-10 MED ORDER — DIPHENHYDRAMINE HCL 12.5 MG/5ML PO ELIX: 12.5000 mg | ORAL_SOLUTION | Freq: Four times a day (QID) | ORAL | Status: DC | PRN

## 2020-07-10 MED ORDER — SODIUM CHLORIDE 0.9% FLUSH: 3.0000 mL | Freq: Two times a day (BID) | INTRAVENOUS | Status: DC

## 2020-07-10 MED ORDER — BELLADONNA ALKALOIDS-OPIUM 16.2-60 MG RE SUPP: 1.0000 | Freq: Four times a day (QID) | RECTAL | Status: DC | PRN

## 2020-07-10 MED ORDER — HYDROMORPHONE HCL 1 MG/ML IJ SOLN: 0.5000 mg | INTRAMUSCULAR | Status: DC | PRN

## 2020-07-10 MED ORDER — MIDAZOLAM HCL 2 MG/2ML IJ SOLN
INTRAMUSCULAR | Status: AC
  Filled 2020-07-10: qty 2

## 2020-07-10 MED ORDER — HYDROMORPHONE HCL 1 MG/ML IJ SOLN
INTRAMUSCULAR | Status: DC | PRN
Start: 2020-07-10 — End: 2020-07-10
  Administered 2020-07-10 (×3): 1 mg via INTRAVENOUS

## 2020-07-10 MED ORDER — LACTATED RINGERS IV SOLN: INTRAVENOUS | Status: DC | PRN

## 2020-07-10 MED ORDER — DEXAMETHASONE SODIUM PHOSPHATE 10 MG/ML IJ SOLN
INTRAMUSCULAR | Status: AC
  Filled 2020-07-10: qty 1

## 2020-07-10 MED ORDER — CEPHALEXIN 500 MG PO CAPS: 500.0000 mg | ORAL_CAPSULE | Freq: Two times a day (BID) | ORAL | 0 refills | Status: AC

## 2020-07-10 MED ORDER — SPIRONOLACTONE 25 MG PO TABS
50.0000 mg | ORAL_TABLET | Freq: Every day | ORAL | Status: DC
  Administered 2020-07-11 – 2020-07-13 (×3): 50 mg via ORAL
  Filled 2020-07-10 (×3): qty 2

## 2020-07-10 MED ORDER — BACITRACIN-NEOMYCIN-POLYMYXIN 400-5-5000 EX OINT: 1.0000 "application " | TOPICAL_OINTMENT | Freq: Three times a day (TID) | CUTANEOUS | Status: DC | PRN

## 2020-07-10 MED ORDER — SUGAMMADEX SODIUM 500 MG/5ML IV SOLN
INTRAVENOUS | Status: AC
  Filled 2020-07-10: qty 5

## 2020-07-10 MED ORDER — DOCUSATE SODIUM 100 MG PO CAPS
100.0000 mg | ORAL_CAPSULE | Freq: Two times a day (BID) | ORAL | Status: DC
  Administered 2020-07-10 – 2020-07-13 (×6): 100 mg via ORAL
  Filled 2020-07-10 (×7): qty 1

## 2020-07-10 MED ORDER — ONDANSETRON HCL 4 MG/2ML IJ SOLN
4.0000 mg | INTRAMUSCULAR | Status: DC | PRN
  Administered 2020-07-10: 4 mg via INTRAVENOUS
  Filled 2020-07-10: qty 2

## 2020-07-10 MED ORDER — ONDANSETRON HCL 4 MG/2ML IJ SOLN
INTRAMUSCULAR | Status: AC
  Filled 2020-07-10: qty 2

## 2020-07-10 MED ORDER — OXYCODONE-ACETAMINOPHEN 5-325 MG PO TABS
1.0000 | ORAL_TABLET | ORAL | 0 refills | Status: AC | PRN
Start: 2020-07-10 — End: ?

## 2020-07-10 MED ORDER — CHLORHEXIDINE GLUCONATE 0.12 % MT SOLN
15.0000 mL | Freq: Once | OROMUCOSAL | Status: AC
  Administered 2020-07-10: 15 mL via OROMUCOSAL

## 2020-07-10 MED ORDER — PROPOFOL 10 MG/ML IV BOLUS
INTRAVENOUS | Status: DC | PRN
  Administered 2020-07-10: 200 mg via INTRAVENOUS

## 2020-07-10 MED ORDER — DEXAMETHASONE SODIUM PHOSPHATE 10 MG/ML IJ SOLN
INTRAMUSCULAR | Status: DC | PRN
  Administered 2020-07-10: 10 mg via INTRAVENOUS

## 2020-07-10 MED ORDER — CEFAZOLIN SODIUM-DEXTROSE 2-4 GM/100ML-% IV SOLN
INTRAVENOUS | Status: AC
  Filled 2020-07-10: qty 200

## 2020-07-10 MED ORDER — DEXMEDETOMIDINE (PRECEDEX) IN NS 20 MCG/5ML (4 MCG/ML) IV SYRINGE
PREFILLED_SYRINGE | INTRAVENOUS | Status: AC
  Filled 2020-07-10: qty 5

## 2020-07-10 MED ORDER — ROCURONIUM BROMIDE 100 MG/10ML IV SOLN
INTRAVENOUS | Status: DC | PRN
  Administered 2020-07-10: 30 mg via INTRAVENOUS
  Administered 2020-07-10: 10 mg via INTRAVENOUS
  Administered 2020-07-10: 100 mg via INTRAVENOUS

## 2020-07-10 MED ORDER — AMLODIPINE BESYLATE 5 MG PO TABS
5.0000 mg | ORAL_TABLET | Freq: Every day | ORAL | Status: DC
  Administered 2020-07-10 – 2020-07-13 (×4): 5 mg via ORAL
  Filled 2020-07-10 (×4): qty 1

## 2020-07-10 MED ORDER — CHLORHEXIDINE GLUCONATE CLOTH 2 % EX PADS
6.0000 | MEDICATED_PAD | Freq: Every day | CUTANEOUS | Status: DC
  Administered 2020-07-11 – 2020-07-13 (×3): 6 via TOPICAL

## 2020-07-10 MED ORDER — DIPHENHYDRAMINE HCL 50 MG/ML IJ SOLN: 12.5000 mg | Freq: Four times a day (QID) | INTRAMUSCULAR | Status: DC | PRN

## 2020-07-10 MED ORDER — ZOLPIDEM TARTRATE 5 MG PO TABS: 5.0000 mg | ORAL_TABLET | Freq: Every evening | ORAL | Status: DC | PRN

## 2020-07-10 MED ORDER — SODIUM CHLORIDE 0.9 % IV SOLN: INTRAVENOUS | Status: DC

## 2020-07-10 MED ORDER — OXYCODONE-ACETAMINOPHEN 5-325 MG PO TABS: 1.0000 | ORAL_TABLET | ORAL | Status: DC | PRN

## 2020-07-10 SURGICAL SUPPLY — 42 items
ADAPTER IRRIG TUBE 2 SPIKE SOL (ADAPTER) ×2 IMPLANT
ADPR TBG 2 SPK PMP STRL ASCP (ADAPTER)
BAG DRN RND TRDRP ANRFLXCHMBR (UROLOGICAL SUPPLIES) ×4
BAG URINE DRAIN 2000ML AR STRL (UROLOGICAL SUPPLIES) ×8 IMPLANT
BAG URO CATCHER STRL LF (MISCELLANEOUS) ×4 IMPLANT
CATH FOLEY 3WAY 30CC 22FR (CATHETERS) ×3 IMPLANT
CATH FOLEY 3WAY 30CC 24FR (CATHETERS)
CATH HEMA 3WAY 30CC 22FR COUDE (CATHETERS) ×3 IMPLANT
CATH URET 5FR 28IN OPEN ENDED (CATHETERS) ×1 IMPLANT
CATH URTH STD 24FR FL 3W 2 (CATHETERS) ×1 IMPLANT
CONTAINER COLLECT MORCELLATR (MISCELLANEOUS) ×1 IMPLANT
DRAPE UTILITY 15X26 TOWEL STRL (DRAPES) IMPLANT
ELECT BIVAP BIPO 22/24 DONUT (ELECTROSURGICAL) ×4
ELECT REM PT RETURN 15FT ADLT (MISCELLANEOUS) IMPLANT
ELECTRD BIVAP BIPO 22/24 DONUT (ELECTROSURGICAL) ×1 IMPLANT
FIBER LASER MOSES 550 DFL (Laser) ×1 IMPLANT
FILTER OVERFLOW MORCELLATOR (FILTER) ×1 IMPLANT
GLOVE BIOGEL M 7.0 STRL (GLOVE) ×4 IMPLANT
GLOVE BIOGEL M STRL SZ7.5 (GLOVE) ×4 IMPLANT
GOWN STRL REUS W/TWL LRG LVL3 (GOWN DISPOSABLE) ×8 IMPLANT
GUIDEWIRE STR DUAL SENSOR (WIRE) IMPLANT
HOLDER FOLEY CATH W/STRAP (MISCELLANEOUS) ×4 IMPLANT
IV CATH 14GX2 1/4 (CATHETERS) IMPLANT
KIT TURNOVER KIT A (KITS) ×4 IMPLANT
LOOP CUT BIPOLAR 24F LRG (ELECTROSURGICAL) ×6 IMPLANT
MANIFOLD NEPTUNE II (INSTRUMENTS) ×4 IMPLANT
MBRN O SEALING YLW 17 FOR INST (MISCELLANEOUS)
MEMBRANE SLNG YLW 17 FOR INST (MISCELLANEOUS) ×1 IMPLANT
MORCELLATOR COLLECT CONTAINER (MISCELLANEOUS)
MORCELLATOR OVERFLOW FILTER (FILTER)
MORCELLATOR ROTATION 4.75 335 (MISCELLANEOUS) ×1 IMPLANT
PACK CYSTO (CUSTOM PROCEDURE TRAY) ×5 IMPLANT
SET IRRIG Y TYPE TUR BLADDER L (SET/KITS/TRAYS/PACK) IMPLANT
SLEEVE SURGEON STRL (DRAPES) ×8 IMPLANT
SYR 30ML LL (SYRINGE) ×4 IMPLANT
SYR TOOMEY IRRIG 70ML (MISCELLANEOUS) ×8
SYRINGE TOOMEY IRRIG 70ML (MISCELLANEOUS) ×4 IMPLANT
TUBE PUMP MORCELLATOR PIRANHA (TUBING) ×1 IMPLANT
TUBING CONNECTING 10 (TUBING) ×3 IMPLANT
TUBING CONNECTING 10' (TUBING) ×1
TUBING UROLOGY SET (TUBING) ×8 IMPLANT
WATER STERILE IRR 1000ML POUR (IV SOLUTION) ×4 IMPLANT

## 2020-07-10 NOTE — Anesthesia Procedure Notes (Signed)
Procedure Name: Intubation Performed by: Rosaland Lao, CRNA Pre-anesthesia Checklist: Patient identified, Emergency Drugs available, Suction available and Patient being monitored Patient Re-evaluated:Patient Re-evaluated prior to induction Oxygen Delivery Method: Circle system utilized Preoxygenation: Pre-oxygenation with 100% oxygen Induction Type: IV induction Ventilation: Mask ventilation without difficulty and Oral airway inserted - appropriate to patient size Laryngoscope Size: Mac and 4 Grade View: Grade II Tube type: Oral Tube size: 7.5 mm Number of attempts: 1 Airway Equipment and Method: Stylet and Oral airway Placement Confirmation: ETT inserted through vocal cords under direct vision,  positive ETCO2 and breath sounds checked- equal and bilateral Secured at: 23 cm Tube secured with: Tape Dental Injury: Teeth and Oropharynx as per pre-operative assessment

## 2020-07-10 NOTE — Progress Notes (Addendum)
Day of Surgery Subjective: Pain controlled. Irrigant dark pink, however foley had been removed from traction. Irrigated until on clear. Replaced on traction.  Objective: Vital signs in last 24 hours: Temp:  [97.9 F (36.6 C)-100.4 F (38 C)] 97.9 F (36.6 C) (11/29 1645) Pulse Rate:  [72-94] 74 (11/29 1715) Resp:  [14-20] 14 (11/29 1715) BP: (108-165)/(76-110) 125/76 (11/29 1715) SpO2:  [93 %-100 %] 93 % (11/29 1715) Weight:  [120.2 kg-122.2 kg] 120.2 kg (11/29 1038)  Intake/Output from previous day: No intake/output data recorded. Intake/Output this shift: Total I/O In: 6200 [I.V.:1200; Other:4500; IV Piggyback:500] Out: 1188 [Urine:3250; Blood:500]  Physical Exam:  General: Alert and oriented CV: RRR Lungs: Clear Abdomen: Soft, ND, NT Ext: NT, No erythema  Lab Results: Recent Labs    07/10/20 1628  HGB 10.0*  HCT 32.2*   BMET Recent Labs    07/10/20 1628  NA 140  K 4.1  CL 107  CO2 24  GLUCOSE 178*  BUN 14  CREATININE 1.32*  CALCIUM 7.9*     Studies/Results: No results found.  Assessment/Plan: 1. BPH S/p TURP 07/10/2020  -Pain control prn -Diet as tolerated -Irrigate foley as needed for clots or decreased drainage -Leave foley to traction overnight -Hgb 10 from preop 12.5 -Recheck hgb at 10PM and in AM   LOS: 0 days   Matt R. Nan Maya MD 07/10/2020, 5:34 PM [facility]  Pager: [phone]  Remains on traction, light pink. Placed additional 4ml in balloon. Now there is 74ml in balloon. Placed saline bag underneath rectum for him to sit to provide external compression. Giving 1g TXA. NPO after midnight in case need for procedure tomorrow.  [facility] R[facility]  Pager: [phone]

## 2020-07-10 NOTE — Op Note (Signed)
Operative Note  Preoperative diagnosis:  1.  BPH with bladder outlet obstruction  Postoperative diagnosis: 1.  BPH with bladder outlet obstruction  Procedure(s): 1.  Bipolar transurethral resection of prostate  Surgeon: Justin Alberts, MD  Assistants:  None  Anesthesia:  General  Complications:  None  EBL:  535ml  Specimens: 1. Prostate chips ID Type Source Tests Collected by Time Destination  1 : prostate chips Tissue PATH Prostate TURP SURGICAL PATHOLOGY Justin Lima, MD 07/10/2020 1406     Drains/Catheters: 1.  24Fr 3 way catheter with 66ml water into balloon  Intraoperative findings:   1. 160 gram prostate, trilobar obstruction, elongated prostate.  Small capacity bladder approximately 190 mL.  Ureteral orifices were just beyond the bladder neck and out of harm's way of the resection.  Successful resection of trilobar prostate with excellent defect.  Open sinus on the right lateral aspect of the prostatic fossa with some difficulty controlling hemostasis.  However when the catheter was placed on tension, there is clear drainage from the catheter.  Indication:  Justin Bates is a 59 y.o. male with BPH with bladder outlet obstruction presenting for transurethral resection of the prostate. He had a TRUS volume of 167 g.  He had been and urinary retention with chronic indwelling Foley catheter in place for several months.  We are planning on doing a HoLEP however a piece of the morcellator was misplaced in the SPT and this was unable to be obtained for the case today.  I offered to reschedule on another day or proceed with TURP. After thorough discussion including all relevant risk benefits and alternatives, he presents today for a bipolar TURP.  Description of procedure: The indication, alternatives, benefits and risks were discussed with the patient and informed consent was obtained.  Patient was brought to the operating room table, positioned supine, secured with a safety  strap.  Pneumatic compression devices were placed on the lower extremities.  After the administration of intravenous antibiotics and general anesthesia, the patient was repositioned into the dorsal lithotomy position.  All pressure points were carefully padded.  A rectal examination was performed confirming a smooth symmetric enlarged gland.  The genitalia were prepped and draped in standard sterile manner.  A timeout was completed, verifying the correct patient, surgical procedure and positioning prior to beginning the procedure.  Isotonic sodium chloride was used for irrigation.  A 26 French continuous-flow resectoscope sheath with the visual obturator and a 30 degree lens was advanced under direct vision into the bladder.  The anterior urethra appeared normal in its entirety.The prostatic urethra was elongated with trilobar hyperplasia.  On cystoscopic evaluation, his bladder capacity appeared smaller, approximately 200 mL.  There were no tumors, stones or foreign bodies present. The bladder was trabeculated with normal-appearing mucosa.  Both ureteral orifices were in their normal anatomic positions with clear urinary reflux noted bilaterally.  The obturator was removed and replaced by the working element with a resection loop.  The location of the ureteral orifices and the prostatic configuration were again confirmed.  Starting at the bladder neck and proceeding distally to the verumontanum a transurethral section of the prostate was performed using bipolar using energy of 4 and 5 for cutting and coagulation, respectively.  The procedure began at the bladder neck at the 5 o'clock and 7 o'clock positions and carefully carried distally to the verumontanum, resecting the intervening prostatic adenoma.  Next the left lateral lobe was resected to the level of the transverse capsular fibers.  The identical procedure was performed on the right lobe.  Attention was then directed anteriorly and the resection was  completed from the 10 o'clock to 2 o'clock positions. The bladder was irrigated with a Toomey syringe, ensuring removal of all prostate chips which were sent to pathology for evaluation.  All bleeding vessels were fulgurated achieving meticulous hemostasis.  Of note, towards the end of the resection and noted an open sinus on the right lateral aspect of the prostatic fossa.  I attempted to fulgurate this with a rollerball however given his large size of the open sinus, there is unable to be achieved. Having completed the resection and the chips removed, we again confirmed hemostasis with the loop with coagulating current.  The only area of bleeding was on the right lateral aspect of the prostatic fossa.  Upon completion of the entire procedure, the bladder and posterior urethra were reexamined, confirming open prostatic urethra and bladder neck without evidence of bleeding or perforation.  Both ureteral orifices and the external sphincter were noted to be intact.  The resectoscope was withdrawn under direct vision and a 24 [place] three-way Foley catheter with a 30 cc balloon was inserted into the bladder.  The balloon was inflated with 60 cc of sterile water and the catheter was placed on traction.  With the catheter on traction, manual irrigations ensured clear return of the irrigant.  The procedure was terminated.  The catheter was attached to a drainage bag and continuous bladder irrigation was started with normal saline.  The patient was positioned supine.  At the end of the procedure, all counts were correct.  Patient tolerated the procedure well and was taken to the recovery room satisfactory condition.  Plan: Continuous bladder irrigation overnight with gentle Foley traction.  Plan to discharge home tomorrow with Foley catheter in place and void trial in the office in 7 days.  [facility]  Pager: [phone]

## 2020-07-10 NOTE — Transfer of Care (Signed)
Immediate Anesthesia Transfer of Care Note  Patient: Justin Bates  Procedure(s) Performed: TRANSURETHRAL RESECTION OF THE PROSTATE (TURP) (N/A Prostate)  Patient Location: PACU  Anesthesia Type:General  Level of Consciousness: awake, alert  and oriented  Airway & Oxygen Therapy: Patient Spontanous Breathing and Patient connected to face mask  Post-op Assessment: Report given to RN and Post -op Vital signs reviewed and stable  Post vital signs: Reviewed and stable  Last Vitals:  Vitals Value Taken Time  BP    Temp    Pulse    Resp    SpO2      Last Pain:  Vitals:   07/10/20 1115  TempSrc:   PainSc: 9       Patients Stated Pain Goal: 9 (08/05/99 1809)  Complications: No complications documented.

## 2020-07-10 NOTE — H&P (Addendum)
Office Visit Report     05/19/2020   --------------------------------------------------------------------------------   Conni Slipper  MRN: [id number]  DOB: 07-31-1961, 59 year old Male  SSN: [id number]   PRIMARY CARE:    REFERRING:  Brunetta Jeans, PA  PROVIDER:  Rexene Alberts, M.D.  LOCATION:  [facility] [phone]     --------------------------------------------------------------------------------   CC/HPI: Arkel Cartwright is a 59 year old male seen in consultation for BPH with LUTS. He is being referred by Dr. Jeffie Pollock.   He has a history of urinary retention. Foley catheter is in place since July 2021. He failed a void trial on 02/24/2020. He underwent urodynamics on 05/03/2020 that revealed a max capacity of 195 mL. He did generate an unstable voluntary contraction. His PVR was 179 mL. His prostate ultrasound on 05/03/2020 revealed 166 g prostate.   Prior to insertion of Foley catheter, he had worsening lower urinary tract symptoms to where he was unable to start his stream and had some overflow incontinence. He denies any dysuria or hematuria. He was taking alfuzosin prior.   He reports bothersome LUTS, taking alfuzosin. His IPSS score is 25. with a QoL score of 5.  He has nocturia x 1-2.  He reports daytime urinary frequency  Weak urine stream.  Denies gross hematuria or dysuria.  Asymptomatic for infection.  He is interested in surgical therapy.   Patient currently denies fever, chills, sweats, nausea, vomiting, abdominal or flank pain.     ALLERGIES: None   MEDICATIONS: Aldactone 50 mg tablet  Amlodipine Besilate  Sildenafil Citrate 20 mg tablet 1 tablet PO PRN  Vitamin D3     Notes: metor   GU PSH: Complex cystometrogram, w/ void pressure and urethral pressure profile studies, any technique - 05/03/2020 Complex Uroflow - 05/03/2020 Emg surf Electrd - 05/03/2020 Inject For cystogram - 05/03/2020 Intrabd voidng Press - 05/03/2020       PSH Notes: cyst removed  from right shoulder   NON-GU PSH: None   GU PMH: BPH w/LUTS, He has a 159ml prostate with high pressure voiding and persistent retention on UDS. I discussed adding finasteride or proceeding with a procedure. I reviewed the options and risks including a simple prostatectomy, HOLEP and PEA and will send him to see Dr. Abner Greenspan for consideration of a HOLEP. - 05/15/2020, - 04/22/2019, - 2019, - 2018, - 2017, - 2017 Urinary Retention - 05/15/2020, - 05/03/2020, - 02/29/2020, - 02/24/2020, - 02/17/2020 ED due to arterial insufficiency - 04/22/2019, - 2019 Elevated PSA - 04/22/2019, - 2019, - 2018, - 2017, - 2017 Urinary Frequency - 2017 Urinary Hesitancy - 2017    NON-GU PMH: GERD Hypertension    FAMILY HISTORY: 1 son - Son 69 daughters - Daughter No pertinent family history - Other   SOCIAL HISTORY: Marital Status: Married Preferred Language: English; Ethnicity: ; Race: Black or African American Current Smoking Status: Patient has never smoked.  Has never drank.  Does not use drugs. Drinks 2 caffeinated drinks per day. Patient's occupation Equities trader.    REVIEW OF SYSTEMS:    GU Review Male:   Patient denies frequent urination, hard to postpone urination, burning/ pain with urination, get up at night to urinate, leakage of urine, stream starts and stops, trouble starting your stream, have to strain to urinate , erection problems, and penile pain.  Gastrointestinal (Upper):   Patient denies nausea, vomiting, and indigestion/ heartburn.  Gastrointestinal (Lower):   Patient denies diarrhea and constipation.  Constitutional:   Patient denies fever,  night sweats, weight loss, and fatigue.  Skin:   Patient denies skin rash/ lesion and itching.  Eyes:   Patient denies blurred vision and double vision.  Ears/ Nose/ Throat:   Patient denies sore throat and sinus problems.  Hematologic/Lymphatic:   Patient denies swollen glands and easy bruising.  Cardiovascular:   Patient denies leg swelling  and chest pains.  Respiratory:   Patient denies cough and shortness of breath.  Endocrine:   Patient denies excessive thirst.  Musculoskeletal:   Patient denies joint pain and back pain.  Neurological:   Patient denies headaches and dizziness.  Psychologic:   Patient denies depression and anxiety.   VITAL SIGNS:      05/19/2020 08:07 AM  Weight 265 lb / 120.2 kg  Height 78 in / 198.12 cm  BP 172/97 mmHg  Pulse 73 /min  Temperature 97.5 F / 36.3 C  BMI 30.6 kg/m   GU PHYSICAL EXAMINATION:    Scrotum: No lesions. No edema. No cysts. No warts.  Epididymides: Right: no spermatocele, no masses, no cysts, no tenderness, no induration, no enlargement. Left: no spermatocele, no masses, no cysts, no tenderness, no induration, no enlargement.  Testes: No tenderness, no swelling, no enlargement left testes. No tenderness, no swelling, no enlargement right testes. Normal location left testes. Normal location right testes. No mass, no cyst, no varicocele, no hydrocele left testes. No mass, no cyst, no varicocele, no hydrocele right testes.  Urethral Meatus: Normal size. No lesion, no wart, no discharge, no polyp. Normal location.  Penis: Circumcised, no warts, no cracks. No dorsal Peyronie's plaques, no left corporal Peyronie's plaques, no right corporal Peyronie's plaques, no scarring, no warts. No balanitis, no meatal stenosis.   MULTI-SYSTEM PHYSICAL EXAMINATION:    Constitutional: Well-nourished. No physical deformities. Normally developed. Good grooming.  Respiratory: No labored breathing, no use of accessory muscles.   Cardiovascular: Normal temperature, normal extremity pulses, no swelling, no varicosities.  Gastrointestinal: No mass, no tenderness, no rigidity, non obese abdomen.     Complexity of Data:  Source Of History:  Patient, Medical Record Summary  Records Review:   AUA Symptom Score, Previous Doctor Records, Previous Patient Records, IIEF Score  Urine Test Review:   Urinalysis   Urodynamics Review:   Review Bladder Scan, Review Flow Rate, Review Urodynamics Tests   04/13/19 12/04/17 11/19/16 05/29/16  PSA  Total PSA 3.40 ng/mL 5.50 ng/mL 2.82 ng/dl 3.38 ng/dl    PROCEDURES: None   ASSESSMENT:      ICD-10 Details  1 GU:   BPH w/LUTS - N40.1   2   Urinary Retention - R33.8    PLAN:           Schedule Return Visit/Planned Activity: 1 Month - Nurse Visit             Note: Catheter exchange          Document Letter(s):  Created for Patient: Clinical Summary         Notes:   1. BPH with LUTS: In urinary retention since 02/2020. IPSS 25. TRUS vol 150ml. UDS with bladder capacity 137ml. Discussed HoLEP vs robotic simple prostatectomy. Discussed that given size of prostate and bladder capacity, we have have to stage the HoLEP by resecting middle and one lateral lobe prior to treating contralateral side. Pt in agreement and consents to this procedure.   Risks and benefits of Holmium Laser Enucleation of the Prostate were reviewed in detail including infection, bleeding, blood transfusion, injury to bladder/urethra/surrounding  structures, erectile dysfunction, urinary incontinence, bladder neck contracture, persistent obstructive and irritative voiding symptoms, and global anesthesia risks including but not limited to CVA, MI, DVT, PE, pneumonia, and death. He expressed understanding and desire to proceed.   2. Urinary retention: From BOO as above. Exchange catheter monthly. Will schedule for HoLEP.    Signed by Rexene Alberts, M.D. on 05/19/20 at 8:52 AM (EDT)  Urology Preoperative H&P   Chief Complaint: BPH  History of Present Illness: KALID GHAN is a 59 y.o. male with BPH here for HoLEP. Denies fevers or chills.  Past Medical History:  Diagnosis Date  . Anxiety   . Arthritis   . Diabetes mellitus without complication ([facility])   . GERD (gastroesophageal reflux disease)   . History of chicken pox   . Hypertension   . Pneumonia     Past Surgical History:   Procedure Laterality Date  . COLONOSCOPY    . LIPOMA EXCISION Right 12/21/2014   Procedure: EXCISION RIGHT SHOULDER  LIPOMA;  Surgeon: Erroll Luna, MD;  Location: [place];  Service: General;  Laterality: Right;  . TONSILLECTOMY     as child    Allergies:  Allergies  Allergen Reactions  . Metformin     GI Upset (intolerance)    Family History  Problem Relation Age of Onset  . Hypertension Mother        Living  . Stroke Mother   . Hypertension Father        Living  . Stroke Father   . Healthy Brother        x4  . Healthy Sister        x5  . Healthy Son        x1  . Healthy Daughter        x2    Social History:  reports that he has never smoked. He has never used smokeless tobacco. He reports that he does not drink alcohol and does not use drugs.  ROS: A complete review of systems was performed.  All systems are negative except for pertinent findings as noted.  Physical Exam:  Vital signs in last 24 hours:   Constitutional:  Alert and oriented, No acute distress Cardiovascular: Regular rate and rhythm Respiratory: Normal respiratory effort, Lungs clear bilaterally GI: Abdomen is soft, nontender, nondistended, no abdominal masses GU: No CVA tenderness Lymphatic: No lymphadenopathy Neurologic: Grossly intact, no focal deficits Psychiatric: Normal mood and affect  Laboratory Data:  No results for input(s): WBC, HGB, HCT, PLT in the last 72 hours.  No results for input(s): NA, K, CL, GLUCOSE, BUN, CALCIUM, CREATININE in the last 72 hours.  Invalid input(s): CO3   No results found for this or any previous visit (from the past 24 hour(s)). Recent Results (from the past 240 hour(s))  SARS CORONAVIRUS 2 (TAT 6-24 HRS) Nasopharyngeal Nasopharyngeal Swab     Status: None   Collection Time: 07/07/20  9:32 AM   Specimen: Nasopharyngeal Swab  Result Value Ref Range Status   SARS Coronavirus 2 NEGATIVE NEGATIVE Final    Comment: (NOTE) SARS-CoV-2  target nucleic acids are NOT DETECTED.  The SARS-CoV-2 RNA is generally detectable in upper and lower respiratory specimens during the acute phase of infection. Negative results do not preclude SARS-CoV-2 infection, do not rule out co-infections with other pathogens, and should not be used as the sole basis for treatment or other patient management decisions. Negative results must be combined with clinical observations, patient history, and epidemiological  information. The expected result is Negative.  Fact Sheet for Patients: SugarRoll.be  Fact Sheet for Healthcare Providers: https://www.woods-mathews.com/  This test is not yet approved or cleared by the [place] FDA and  has been authorized for detection and/or diagnosis of SARS-CoV-2 by FDA under an Emergency Use Authorization (EUA). This EUA will remain  in effect (meaning this test can be used) for the duration of the COVID-19 declaration under Se ction 564(b)(1) of the Act, 21 U.S.C. section 360bbb-3(b)(1), unless the authorization is terminated or revoked sooner.  Performed at [facility], [place] [address], [place], [place] [address]     Renal Function: No results for input(s): CREATININE in the last 168 hours. Estimated Creatinine Clearance: 86.9 mL/min (A) (by C-G formula based on SCr of 1.31 mg/dL (H)).  Radiologic Imaging: No results found.  I independently reviewed the above imaging studies.  Assessment and Plan RAJESH WYSS is a 59 y.o. male with BPH here for HoLEP. Ok to proceed.  Risks and benefits of Holmium Laser Enucleation of the Prostate were reviewed in detail including infection, bleeding, blood transfusion, injury to bladder/urethra/surrounding structures, erectile dysfunction, urinary incontinence, bladder neck contracture, persistent obstructive and irritative voiding symptoms, and global anesthesia risks including but not limited to CVA, MI, DVT,  PE, pneumonia, and death. He expressed understanding and desire to proceed.    Matt R. Curry Dulski MD 07/10/2020, 10:19 AM  [facility] Pager: [phone]): [phone]  Notified that a piece is missing in order to use the morcellator. Discussed options with the patient including rescheduling or proceeding with TURP. Pt elects to proceed with TURP.  Matt R. [facility]  Pager: [phone]

## 2020-07-10 NOTE — Anesthesia Preprocedure Evaluation (Addendum)
Anesthesia Evaluation  Patient identified by MRN, date of birth, ID band Patient awake    Reviewed: Allergy & Precautions, NPO status , Patient's Chart, lab work & pertinent test results  Airway Mallampati: I  TM Distance: >3 FB Neck ROM: Full    Dental   Pulmonary    Pulmonary exam normal        Cardiovascular hypertension, Pt. on medications Normal cardiovascular exam     Neuro/Psych Anxiety    GI/Hepatic GERD  Medicated and Controlled,  Endo/Other  diabetes, Type 2  Renal/GU      Musculoskeletal   Abdominal   Peds  Hematology   Anesthesia Other Findings   Reproductive/Obstetrics                             Anesthesia Physical Anesthesia Plan  ASA: II  Anesthesia Plan: General   Post-op Pain Management:    Induction: Intravenous  PONV Risk Score and Plan: Ondansetron and Midazolam  Airway Management Planned: Oral ETT  Additional Equipment:   Intra-op Plan:   Post-operative Plan: Extubation in OR  Informed Consent: I have reviewed the patients History and Physical, chart, labs and discussed the procedure including the risks, benefits and alternatives for the proposed anesthesia with the patient or authorized representative who has indicated his/her understanding and acceptance.       Plan Discussed with: Surgeon and CRNA  Anesthesia Plan Comments:        Anesthesia Quick Evaluation

## 2020-07-10 NOTE — Plan of Care (Signed)

## 2020-07-10 NOTE — Discharge Instructions (Signed)
   Activity:  You are encouraged to ambulate frequently (about every hour during waking hours) to help prevent blood clots from forming in your legs or lungs.  However, you should not engage in any heavy lifting (> 10-15 lbs), strenuous activity, or straining.   Diet: You should advance your diet as instructed by your physician.  It will be normal to have some bloating, nausea, and abdominal discomfort intermittently.   Prescriptions:  You will be provided a prescription for pain medication to take as needed.  If your pain is not severe enough to require the prescription pain medication, you may take extra strength Tylenol instead which will have less side effects.  You should also take a prescribed stool softener to avoid straining with bowel movements as the prescription pain medication may constipate you.   What to call us about: You should call the office [phone]) if you develop fever > 101 or develop persistent vomiting. Activity:  You are encouraged to ambulate frequently (about every hour during waking hours) to help prevent blood clots from forming in your legs or lungs.  However, you should not engage in any heavy lifting (> 10-15 lbs), strenuous activity, or straining.  You have a foley draining your bladder. Call us if problems draining, excessive clots. You will f/u in office on Thursday at 7:45AM for catheter removal.

## 2020-07-11 ENCOUNTER — Encounter (HOSPITAL_COMMUNITY): Payer: Self-pay | Admitting: Urology

## 2020-07-11 DIAGNOSIS — R310 Gross hematuria: Secondary | ICD-10-CM | POA: Diagnosis not present

## 2020-07-11 DIAGNOSIS — R710 Precipitous drop in hematocrit: Secondary | ICD-10-CM | POA: Diagnosis not present

## 2020-07-11 DIAGNOSIS — K219 Gastro-esophageal reflux disease without esophagitis: Secondary | ICD-10-CM | POA: Diagnosis present

## 2020-07-11 DIAGNOSIS — N401 Enlarged prostate with lower urinary tract symptoms: Secondary | ICD-10-CM | POA: Diagnosis present

## 2020-07-11 DIAGNOSIS — F419 Anxiety disorder, unspecified: Secondary | ICD-10-CM | POA: Diagnosis present

## 2020-07-11 DIAGNOSIS — E119 Type 2 diabetes mellitus without complications: Secondary | ICD-10-CM | POA: Diagnosis present

## 2020-07-11 DIAGNOSIS — N138 Other obstructive and reflux uropathy: Secondary | ICD-10-CM | POA: Diagnosis present

## 2020-07-11 DIAGNOSIS — D291 Benign neoplasm of prostate: Secondary | ICD-10-CM | POA: Diagnosis present

## 2020-07-11 DIAGNOSIS — N39490 Overflow incontinence: Secondary | ICD-10-CM | POA: Diagnosis present

## 2020-07-11 DIAGNOSIS — Z888 Allergy status to other drugs, medicaments and biological substances status: Secondary | ICD-10-CM | POA: Diagnosis not present

## 2020-07-11 DIAGNOSIS — Z8249 Family history of ischemic heart disease and other diseases of the circulatory system: Secondary | ICD-10-CM | POA: Diagnosis not present

## 2020-07-11 DIAGNOSIS — K30 Functional dyspepsia: Secondary | ICD-10-CM | POA: Diagnosis not present

## 2020-07-11 DIAGNOSIS — I10 Essential (primary) hypertension: Secondary | ICD-10-CM | POA: Diagnosis present

## 2020-07-11 DIAGNOSIS — R338 Other retention of urine: Secondary | ICD-10-CM | POA: Diagnosis present

## 2020-07-11 DIAGNOSIS — Z823 Family history of stroke: Secondary | ICD-10-CM | POA: Diagnosis not present

## 2020-07-11 LAB — CBC WITH DIFFERENTIAL/PLATELET
Abs Immature Granulocytes: 0.09 10*3/uL — ABNORMAL HIGH (ref 0.00–0.07)
Basophils Absolute: 0 10*3/uL (ref 0.0–0.1)
Basophils Relative: 0 %
Eosinophils Absolute: 0 10*3/uL (ref 0.0–0.5)
Eosinophils Relative: 0 %
HCT: 25.2 % — ABNORMAL LOW (ref 39.0–52.0)
Hemoglobin: 7.9 g/dL — ABNORMAL LOW (ref 13.0–17.0)
Immature Granulocytes: 1 %
Lymphocytes Relative: 9 %
Lymphs Abs: 1.2 10*3/uL (ref 0.7–4.0)
MCH: 30.2 pg (ref 26.0–34.0)
MCHC: 31.3 g/dL (ref 30.0–36.0)
MCV: 96.2 fL (ref 80.0–100.0)
Monocytes Absolute: 0.9 10*3/uL (ref 0.1–1.0)
Monocytes Relative: 7 %
Neutro Abs: 10.6 10*3/uL — ABNORMAL HIGH (ref 1.7–7.7)
Neutrophils Relative %: 83 %
Platelets: 130 10*3/uL — ABNORMAL LOW (ref 150–400)
RBC: 2.62 MIL/uL — ABNORMAL LOW (ref 4.22–5.81)
RDW: 13.7 % (ref 11.5–15.5)
WBC: 12.9 10*3/uL — ABNORMAL HIGH (ref 4.0–10.5)
nRBC: 0 % (ref 0.0–0.2)

## 2020-07-11 LAB — BASIC METABOLIC PANEL
Anion gap: 8 (ref 5–15)
Anion gap: 8 (ref 5–15)
BUN: 18 mg/dL (ref 8–23)
BUN: 18 mg/dL (ref 8–23)
CO2: 23 mmol/L (ref 22–32)
CO2: 24 mmol/L (ref 22–32)
Calcium: 7.8 mg/dL — ABNORMAL LOW (ref 8.9–10.3)
Calcium: 7.8 mg/dL — ABNORMAL LOW (ref 8.9–10.3)
Chloride: 108 mmol/L (ref 98–111)
Chloride: 108 mmol/L (ref 98–111)
Creatinine, Ser: 1.17 mg/dL (ref 0.61–1.24)
Creatinine, Ser: 1.26 mg/dL — ABNORMAL HIGH (ref 0.61–1.24)
GFR, Estimated: >60 mL/min (ref >60)
GFR, Estimated: >60 mL/min (ref >60)
Glucose, Bld: 143 mg/dL — ABNORMAL HIGH (ref 70–99)
Glucose, Bld: 144 mg/dL — ABNORMAL HIGH (ref 70–99)
Potassium: 3.8 mmol/L (ref 3.5–5.1)
Potassium: 3.9 mmol/L (ref 3.5–5.1)
Sodium: 139 mmol/L (ref 135–145)
Sodium: 140 mmol/L (ref 135–145)

## 2020-07-11 LAB — APTT: aPTT: 34 seconds (ref 24–36)

## 2020-07-11 LAB — CBC
HCT: 25 % — ABNORMAL LOW (ref 39.0–52.0)
Hemoglobin: 8 g/dL — ABNORMAL LOW (ref 13.0–17.0)
MCH: 30.8 pg (ref 26.0–34.0)
MCHC: 32 g/dL (ref 30.0–36.0)
MCV: 96.2 fL (ref 80.0–100.0)
Platelets: 144 10*3/uL — ABNORMAL LOW (ref 150–400)
RBC: 2.6 MIL/uL — ABNORMAL LOW (ref 4.22–5.81)
RDW: 13.8 % (ref 11.5–15.5)
WBC: 12.9 10*3/uL — ABNORMAL HIGH (ref 4.0–10.5)
nRBC: 0 % (ref 0.0–0.2)

## 2020-07-11 LAB — HEMOGLOBIN AND HEMATOCRIT, BLOOD
HCT: 25.1 % — ABNORMAL LOW (ref 39.0–52.0)
Hemoglobin: 8 g/dL — ABNORMAL LOW (ref 13.0–17.0)

## 2020-07-11 LAB — PROTIME-INR
INR: 1.3 — ABNORMAL HIGH (ref 0.8–1.2)
Prothrombin Time: 15.6 seconds — ABNORMAL HIGH (ref 11.4–15.2)

## 2020-07-11 LAB — SURGICAL PATHOLOGY

## 2020-07-11 MED ORDER — POTASSIUM CHLORIDE CRYS ER 20 MEQ PO TBCR
20.0000 meq | EXTENDED_RELEASE_TABLET | Freq: Once | ORAL | Status: AC
  Administered 2020-07-11: 20 meq via ORAL
  Filled 2020-07-11: qty 1

## 2020-07-11 MED ORDER — TRANEXAMIC ACID-NACL 1000-0.7 MG/100ML-% IV SOLN
1000.0000 mg | Freq: Once | INTRAVENOUS | Status: AC
  Administered 2020-07-11: 1000 mg via INTRAVENOUS
  Filled 2020-07-11: qty 100

## 2020-07-11 NOTE — Anesthesia Postprocedure Evaluation (Signed)
Anesthesia Post Note  Patient: Justin Bates  Procedure(s) Performed: TRANSURETHRAL RESECTION OF THE PROSTATE (TURP) (N/A Prostate)     Patient location during evaluation: PACU Anesthesia Type: General Level of consciousness: awake and alert Pain management: pain level controlled Vital Signs Assessment: post-procedure vital signs reviewed and stable Respiratory status: spontaneous breathing, nonlabored ventilation, respiratory function stable and patient connected to nasal cannula oxygen Cardiovascular status: blood pressure returned to baseline and stable Postop Assessment: no apparent nausea or vomiting Anesthetic complications: no   No complications documented.  Last Vitals:  Vitals:   07/11/20 1013 07/11/20 1246  BP: (!) 147/83 120/74  Pulse: 80 94  Resp: 18 18  Temp: 36.9 C 36.5 C  SpO2: 98% 98%    Last Pain:  Vitals:   07/11/20 1246  TempSrc: Oral  PainSc:                  Denita Lun DAVID

## 2020-07-11 NOTE — Progress Notes (Signed)
Urine clear yellow on slow drip CBI. Hgb resulted 8 this AM. Recheck at [facility] to keep in house on CBI today.  Matt R. [facility]  Pager: [phone]

## 2020-07-11 NOTE — Progress Notes (Signed)
1 Day Post-Op Subjective: States slept the best he has in months. Denies pain. No nausea or emesis. No fevers. No bladder spasms. Tolerating foley. Foley has turned to light pink on slow drip CBI.  Objective: Vital signs in last 24 hours: Temp:  [97.8 F (36.6 C)-100.4 F (38 C)] 98.2 F (36.8 C) (11/30 0403) Pulse Rate:  [72-94] 83 (11/30 0403) Resp:  [13-20] 16 (11/30 0403) BP: (108-165)/(76-110) 123/79 (11/30 0403) SpO2:  [93 %-100 %] 97 % (11/30 0403) Weight:  [120.2 kg-122.2 kg] 120.2 kg (11/29 1800)  Intake/Output from previous day: 11/29 0701 - 11/30 0700 In: 48185.9 [I.V.:2349.8; IV Piggyback:600] Out: 09311 [Urine:32800; Blood:500] Intake/Output this shift: No intake/output data recorded.  Physical Exam:  General: Alert and oriented CV: RRR Lungs: Clear Abdomen: Soft, ND, NT Ext: NT, No erythema  Lab Results: Recent Labs    07/10/20 1628 07/10/20 2132  HGB 10.0* 8.9*  HCT 32.2* 28.7*   BMET Recent Labs    07/10/20 1628  NA 140  K 4.1  CL 107  CO2 24  GLUCOSE 178*  BUN 14  CREATININE 1.32*  CALCIUM 7.9*     Studies/Results: No results found.  Assessment/Plan: 1. BPH s/p TURP 11/29 for a 160gram prostate. HoLEP equipment was missing a piece so we proceeded with a TURP.  -Pain control prn -Diet as tolerated -Labs still pending this AM. Transfuse for <7 -Clamped CBI. Manually irrigate and titrate up as needed to keep light pink. -Ordered another gram of TXA -May need to keep on CBI for another day -Discussed with patient possibility of PAE or OR for cysto/fulguration if persistent and unable to clear with balloon compression/CBI   LOS: 0 days   Matt R. Yehudit Fulginiti MD 07/11/2020, 7:16 AM [facility]  Pager: [phone]

## 2020-07-11 NOTE — Plan of Care (Signed)

## 2020-07-12 ENCOUNTER — Encounter (HOSPITAL_COMMUNITY): Payer: Self-pay | Admitting: Urology

## 2020-07-12 ENCOUNTER — Inpatient Hospital Stay (HOSPITAL_COMMUNITY): Payer: BC Managed Care – PPO

## 2020-07-12 LAB — CBC WITH DIFFERENTIAL/PLATELET
Abs Immature Granulocytes: 0.04 10*3/uL (ref 0.00–0.07)
Basophils Absolute: 0 10*3/uL (ref 0.0–0.1)
Basophils Relative: 0 %
Eosinophils Absolute: 0 10*3/uL (ref 0.0–0.5)
Eosinophils Relative: 0 %
HCT: 22.5 % — ABNORMAL LOW (ref 39.0–52.0)
Hemoglobin: 7.2 g/dL — ABNORMAL LOW (ref 13.0–17.0)
Immature Granulocytes: 0 %
Lymphocytes Relative: 19 %
Lymphs Abs: 1.9 10*3/uL (ref 0.7–4.0)
MCH: 30.8 pg (ref 26.0–34.0)
MCHC: 32 g/dL (ref 30.0–36.0)
MCV: 96.2 fL (ref 80.0–100.0)
Monocytes Absolute: 1 10*3/uL (ref 0.1–1.0)
Monocytes Relative: 10 %
Neutro Abs: 6.7 10*3/uL (ref 1.7–7.7)
Neutrophils Relative %: 71 %
Platelets: 129 10*3/uL — ABNORMAL LOW (ref 150–400)
RBC: 2.34 MIL/uL — ABNORMAL LOW (ref 4.22–5.81)
RDW: 13.9 % (ref 11.5–15.5)
WBC: 9.7 10*3/uL (ref 4.0–10.5)
nRBC: 0 % (ref 0.0–0.2)

## 2020-07-12 LAB — BASIC METABOLIC PANEL
Anion gap: 8 (ref 5–15)
BUN: 15 mg/dL (ref 8–23)
CO2: 23 mmol/L (ref 22–32)
Calcium: 7.7 mg/dL — ABNORMAL LOW (ref 8.9–10.3)
Chloride: 108 mmol/L (ref 98–111)
Creatinine, Ser: 1.19 mg/dL (ref 0.61–1.24)
GFR, Estimated: >60 mL/min (ref >60)
Glucose, Bld: 121 mg/dL — ABNORMAL HIGH (ref 70–99)
Potassium: 3.6 mmol/L (ref 3.5–5.1)
Sodium: 139 mmol/L (ref 135–145)

## 2020-07-12 LAB — HEMOGLOBIN AND HEMATOCRIT, BLOOD
HCT: 22.4 % — ABNORMAL LOW (ref 39.0–52.0)
Hemoglobin: 7.2 g/dL — ABNORMAL LOW (ref 13.0–17.0)

## 2020-07-12 MED ORDER — POTASSIUM CHLORIDE CRYS ER 20 MEQ PO TBCR
40.0000 meq | EXTENDED_RELEASE_TABLET | Freq: Once | ORAL | Status: AC
  Administered 2020-07-12: 40 meq via ORAL
  Filled 2020-07-12: qty 2

## 2020-07-12 MED ORDER — GENTAMICIN SULFATE 40 MG/ML IJ SOLN
5.0000 mg/kg | INTRAVENOUS | Status: DC
  Filled 2020-07-12: qty 12.75

## 2020-07-12 MED ORDER — HEPARIN SODIUM (PORCINE) 5000 UNIT/ML IJ SOLN: 5000.0000 [IU] | Freq: Three times a day (TID) | INTRAMUSCULAR | Status: DC

## 2020-07-12 MED ORDER — IOHEXOL 350 MG/ML SOLN
100.0000 mL | Freq: Once | INTRAVENOUS | Status: AC | PRN
  Administered 2020-07-12: 100 mL via INTRAVENOUS

## 2020-07-12 MED ORDER — SODIUM CHLORIDE 0.9 % IV SOLN: INTRAVENOUS | Status: DC

## 2020-07-12 MED ORDER — GENTAMICIN SULFATE 40 MG/ML IJ SOLN
5.0000 mg/kg | INTRAVENOUS | Status: DC
  Administered 2020-07-12 – 2020-07-13 (×2): 510 mg via INTRAVENOUS
  Filled 2020-07-12 (×2): qty 12.75

## 2020-07-12 NOTE — Consult Note (Signed)
Chief Complaint: Patient was seen in consultation today for possible prostate artery embolization  Referring Physician(s): Gay,M  Supervising Physician: Jacqulynn Cadet  Patient Status: [facility] - In-pt  History of Present Illness: Justin Bates is a 59 y.o. male with past medical history of anxiety, arthritis, diabetes, GERD, hypertension and BPH with LUTS/urinary retention/BOO.  He has had a chronic indwelling Foley catheter for several months.  He is status post bipolar TURP on 11/29.  Post procedure patient had drop in hemoglobin down to 7.2 from 8.  Blood-tinged urine noted in Foley catheter.  Foley has been irrigated this morning with a few clots and on low-flow CBI earlier today.  Request now received from urology for consideration of prostate artery embolization should hemoglobin continue to drop.  Past Medical History:  Diagnosis Date   Anxiety    Arthritis    Diabetes mellitus without complication (HCC)    GERD (gastroesophageal reflux disease)    History of chicken pox    Hypertension    Pneumonia     Past Surgical History:  Procedure Laterality Date   COLONOSCOPY     LIPOMA EXCISION Right 12/21/2014   Procedure: EXCISION RIGHT SHOULDER  LIPOMA;  Surgeon: Erroll Luna, MD;  Location: [place];  Service: General;  Laterality: Right;   TONSILLECTOMY     as child   TRANSURETHRAL RESECTION OF PROSTATE N/A 07/10/2020   Procedure: TRANSURETHRAL RESECTION OF THE PROSTATE (TURP);  Surgeon: [name], MD;  Location: [facility];  Service: Urology;  Laterality: N/A;    Allergies: Metformin  Medications: Prior to Admission medications   Medication Sig Start Date End Date Taking? Authorizing Provider  alfuzosin (UROXATRAL) 10 MG 24 hr tablet Take 10 mg by mouth daily with breakfast.   Yes [provider]  amLODipine (NORVASC) 5 MG tablet Take 5 mg by mouth daily. 05/05/20  Yes [provider]  Menthol-Methyl Salicylate  (SALONPAS PAIN RELIEF PATCH EX) Apply 1 patch topically daily as needed (pain).   Yes [provider]  spironolactone (ALDACTONE) 50 MG tablet Take 50 mg by mouth daily.    Yes [provider]  tolterodine (DETROL LA) 4 MG 24 hr capsule Take 4 mg by mouth daily. 05/22/20  Yes [provider]  acetaminophen (TYLENOL) 500 MG tablet Take 500 mg by mouth every 6 (six) hours as needed for moderate pain or headache.    [provider]  celecoxib (CELEBREX) 200 MG capsule Take 1 capsule (200 mg total) by mouth 2 (two) times daily. Patient not taking: Reported on 06/27/2020 12/03/19   [name], MD  cephALEXin (KEFLEX) 500 MG capsule Take 1 capsule (500 mg total) by mouth 2 (two) times daily for 6 doses. 07/10/20 07/13/20  [name], MD  Cholecalciferol (DIALYVITE VITAMIN D 5000) 125 MCG (5000 UT) capsule Take 5,000 Units by mouth daily.    [provider]  docusate sodium (COLACE) 100 MG capsule Take 1 capsule (100 mg total) by mouth daily as needed. 07/10/20 08/09/20  [name], MD  oxyCODONE-acetaminophen (PERCOCET) 5-325 MG tablet Take 1 tablet by mouth every 4 (four) hours as needed for up to 12 doses for severe pain. 07/10/20   [name], MD     Family History  Problem Relation Age of Onset   Hypertension Mother        Living   Stroke Mother    Hypertension Father        Living   Stroke  Father    Healthy Brother        x4   Healthy Sister        x5   Healthy Son        x1   Healthy Daughter        x2    Social History   Socioeconomic History   Marital status: Married    Spouse name: Not on file   Number of children: 3   Years of education: Not on file   Highest education level: Not on file  Occupational History   Not on file  Tobacco Use   Smoking status: Never Smoker   Smokeless tobacco: Never Used  Scientific laboratory technician Use: Never used  Substance and Sexual Activity   Alcohol use: No     Alcohol/week: 0.0 standard drinks   Drug use: No   Sexual activity: Not on file  Other Topics Concern   Not on file  Social History Narrative   Not on file   Social Determinants of Health   Financial Resource Strain:    Difficulty of Paying Living Expenses: Not on file  Food Insecurity:    Worried About Charity fundraiser in the Last Year: Not on file   [employer] of Food in the Last Year: Not on file  Transportation Needs:    Lack of Transportation (Medical): Not on file   Lack of Transportation (Non-Medical): Not on file  Physical Activity:    Days of Exercise per Week: Not on file   Minutes of Exercise per Session: Not on file  Stress:    Feeling of Stress : Not on file  Social Connections:    Frequency of Communication with Friends and Family: Not on file   Frequency of Social Gatherings with Friends and Family: Not on file   Attends Religious Services: Not on file   Active Member of Clubs or Organizations: Not on file   Attends Archivist Meetings: Not on file   Marital Status: Not on file     Review of Systems currently denies fever, chest pain, dyspnea, cough, abdominal/back pain, nausea, vomiting.  He does have mild headache and some persistent blood-tinged urine.  Vital Signs: BP (!) 147/93    Pulse 93    Temp 99.4 F (37.4 C) (Oral)    Resp 15    Ht 6\' 6"  (1.981 m)    Wt 265 lb (120.2 kg)    SpO2 98%    BMI 30.62 kg/m   Physical Exam awake, alert.  Chest clear to auscultation bilaterally.  Heart with normal rate, ectopy noted.  Abdomen soft, positive bowel sounds, nontender.  No lower extremity edema.  Imaging: CT ANGIO PELVIS W OR WO CONTRAST  Result Date: 07/12/2020 CLINICAL DATA:  59 year old male with history of prostatomegaly and lower urinary tract symptoms status post status post trans urethral resection of the prostate on 29/79/8921 complicated postoperatively by persistent hematuria. EXAM: CT ANGIOGRAPHY PELVIS TECHNIQUE:  Non-contrast CT of the chest was initially obtained. Multidetector CT imaging through the pelvis was performed using the standard protocol during and after bolus administration of intravenous contrast. Multiplanar reconstructed images and MIPs were obtained and reviewed to evaluate the vascular anatomy. CONTRAST:  153mL OMNIPAQUE IOHEXOL 350 MG/ML SOLN COMPARISON:  CT abdomen and pelvis from 12/17/2015 FINDINGS: CTA PELVIS FINDINGS VASCULAR Aorta: The visualized distal aorta is normal in caliber and patent. Inflow: Patent without evidence of aneurysm, dissection, vasculitis or significant stenosis. 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 Proximal Outflow: Patent without evidence of aneurysm, dissection, vasculitis, or significant stenosis. Veins: Normal caliber.  Patent. Review of the MIP images confirms the above findings. NON-VASCULAR Ureters/Bladder: Foley catheter in place. Scattered foci of gas within the bladder, as expected after instrumentation. There is high-density material layering in the otherwise decompressed bladder compatible with blood products. Bowel: The visualized large and small bowel are normal in course and caliber without evidence of wall thickening. The appendix appears normal. Lymphatic: No pelvic lymphadenopathy. Reproductive: Postsurgical changes after trans urethral resection of the prostate which measures approximately 6 cm in maximum axial dimension. Heterogeneous attenuation within the prostate gland, though no evidence of active extravasation. Other: No pelvic ascites. Musculoskeletal: No acute osseous abnormality. No aggressive appearing osseous lesion. Review of the MIP images confirms the above findings. IMPRESSION: 1. Recent postsurgical changes after transurethral resection of the prostate with indwelling large volume Foley catheter in place. There are layering blood products within the  urinary bladder, however no evidence of active extravasation. 2. 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 3. 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 Ruthann Cancer, MD [facility] Electronically Signed   By: Ruthann Cancer MD   On: 07/12/2020 13:21    Labs:  CBC: Recent Labs    06/29/20 1305 06/29/20 1305 07/10/20 1628 07/10/20 2132 07/11/20 0802 07/11/20 1317 07/12/20 0557 07/12/20 1355  WBC 4.7  --  13.9*  --  12.9*   12.9*  --  9.7  --   HGB 12.5*   < > 10.0*   < > 7.9*   8.0* 8.0* 7.2* 7.2*  HCT 38.6*   < > 32.2*   < > 25.2*   25.0* 25.1* 22.5* 22.4*  PLT 218  --  167  --  130*   144*  --  129*  --    < > = values in this interval not displayed.    COAGS: Recent Labs    07/11/20 0802  INR 1.3*  APTT 34    BMP: Recent Labs    06/29/20 1305 07/10/20 1628 07/11/20 0802 07/12/20 0557  NA 141 140 140   139 139  K 4.1 4.1 3.8   3.9 3.6  CL 104 107 108   108 108  CO2 27 24 24   23 23   GLUCOSE 120* 178* 144*   143* 121*  BUN 19 14 18   18 15   CALCIUM 9.3 7.9* 7.8*   7.8* 7.7*  CREATININE 1.31* 1.32* 1.17   1.26* 1.19  GFRNONAA >60 >60 >60   >60 >60    LIVER FUNCTION TESTS: No results for input(s): BILITOT, AST, ALT, ALKPHOS, PROT, ALBUMIN in the last 8760 hours.  TUMOR MARKERS: No results for input(s): AFPTM, CEA, CA199, CHROMGRNA in the last 8760 hours.  Assessment and Plan: 59 y.o. male with past medical history of anxiety, arthritis, diabetes, GERD, hypertension and BPH with LUTS/urinary retention/BOO.  He has had a chronic indwelling Foley catheter for several months.  He is status post bipolar TURP on 11/29.  Post procedure patient had drop in hemoglobin down to 7.2 from 8.  Blood-tinged urine noted in Foley catheter.  Foley has been irrigated this morning with a few clots and on low-flow  CBI earlier today.  Request now received from  urology for consideration of prostate artery embolization should hemoglobin continue to drop.  Follow-up hemoglobin this afternoon remains at 7.2.  Creatinine 1.19, plts 129k, PT/INR 15.6/1.3.  COVID 19 neg. CT angiogram of abdomen pelvis today revealed:  1. Recent postsurgical changes after transurethral resection of the prostate with indwelling large volume Foley catheter in place. There are layering blood products within the urinary bladder, however no evidence of active extravasation. 2. 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 3. 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 Laurence Ferrari.  At this time we will continue to monitor patient's hemoglobin and reassess in a.m. to determine if arteriography needed.  Risks and benefits of procedure were discussed with the patient including, but not limited to bleeding, infection, vascular injury or contrast induced renal failure.  This interventional procedure involves the use of X-rays and because of the nature of the planned procedure, it is possible that we will have prolonged use of X-ray fluoroscopy.  Potential radiation risks to you include (but are not limited to) the following: - A slightly elevated risk for cancer  several years later in life. This risk is typically less than 0.5% percent. This risk is low in comparison to the normal incidence of human cancer, which is 33% for women and 50% for men according to the [place]. - Radiation induced injury can include skin redness, resembling a rash, tissue breakdown / ulcers and hair loss (which can be temporary or permanent).   The likelihood of either of these occurring depends on the difficulty of the procedure and whether you are sensitive to radiation due to previous procedures,  disease, or genetic conditions.   IF your procedure requires a prolonged use of radiation, you will be notified and given written instructions for further action.  It is your responsibility to monitor the irradiated area for the 2 weeks following the procedure and to notify your physician if you are concerned that you have suffered a radiation induced injury.    All of the patient's questions were answered, patient is agreeable to proceed if needed on 12/2.       Thank you for this interesting consult.  I greatly enjoyed meeting KAYMAN SNUFFER and look forward to participating in their care.  A copy of this report was sent to the requesting provider on this date.  Electronically Signed: D. Rowe Robert, PA-C 07/12/2020, 3:01 PM   I spent a total of  30 minutes   in face to face in clinical consultation, greater than 50% of which was counseling/coordinating care for possible pelvic/prostatic arteriogram with prostate artery embolization

## 2020-07-12 NOTE — Progress Notes (Signed)
2 Days Post-Op Subjective: No real pain. No nausea or emesis - says stomach upset with dinner last night. Temp 100.6 this AM downtrended to 98.   Objective: Vital signs in last 24 hours: Temp:  [97.7 F (36.5 C)-100.6 F (38.1 C)] 98.8 F (37.1 C) (12/01 0646) Pulse Rate:  [80-99] 96 (12/01 0537) Resp:  [12-20] 15 (12/01 0537) BP: (120-147)/(61-83) 129/82 (12/01 0537) SpO2:  [94 %-98 %] 94 % (12/01 0537)  Intake/Output from previous day: 11/30 0701 - 12/01 0700 In: 8299 [P.O.:360; I.V.:1843.5; IV Piggyback:95.5] Out: 8800 [Urine:8800] Intake/Output this shift: Total I/O In: -  Out: 800 [Urine:800]   Physical Exam:  General: Alert and oriented CV: RRR Lungs: Clear Abdomen: Soft, ND, NT Ext: NT, No erythema Foley: irrigated some clots out this AM, now slight pink tinge  Lab Results: Recent Labs    07/11/20 0802 07/11/20 1317 07/12/20 0557  HGB 7.9*  8.0* 8.0* 7.2*  HCT 25.2*  25.0* 25.1* 22.5*   BMET Recent Labs    07/11/20 0802 07/12/20 0557  NA 140  139 139  K 3.8  3.9 3.6  CL 108  108 108  CO2 24  23 23   GLUCOSE 144*  143* 121*  BUN 18  18 15   CREATININE 1.17  1.26* 1.19  CALCIUM 7.8*  7.8* 7.7*     Studies/Results: No results found.  Assessment/Plan: 1. BPH s/p TURP 11/29 for a 160gram prostate. HoLEP equipment was missing a piece so we proceeded with a TURP.  -Pain control prn -NPO for now -Hgb down to 7.2 from 8 yesterday. Recheck at noon. Transfuse for <7. -Irrigated foley with some clots this AM. Clear to light pink off of CBI. Placed on traction and started low flow CBI. -May just need time; however, considering PAE with IR. Will touch base with them today for possible procedure tomorrow if urine does not clear.   LOS: 1 day   Matt R. Nakyiah Kuck MD 07/12/2020, 8:29 AM [facility]  Pager: [phone]

## 2020-07-12 NOTE — Progress Notes (Signed)
Feels well. No pain. No CP, SOB, nausea or emesis. Hungry. Afebrile.  Temp:  [98.4 F (36.9 C)-100.6 F (38.1 C)] 99.4 F (37.4 C) (12/01 1243) Pulse Rate:  [90-99] 93 (12/01 1243) Resp:  [12-20] 15 (12/01 0537) BP: (122-147)/(61-93) 147/93 (12/01 1243) SpO2:  [94 %-98 %] 98 % (12/01 1243)  Urine clear on traction with CBI off with no active extravasation. Some   CTA with no active bleed, some clot in the bladder.  PM H/H pending  1. BPH s/p TURP 11/29 for a 160gram prostate. HoLEP equipment was missing a piece so we proceeded with a TURP.  -Pain control prn -Reg diet. NPO after MN. -Hgb down to 7.2 from 8 yesterday. Recheck this PM. Transfuse for <7. -Irrigated foley with some clots this AM. Currently clear.  -Reviewed CTA as above -May just need time; however, considering PAE with IR. NPO after midnight for possible PAE tomorrow. Will re-eval in AM.  Matt R. [facility]  Pager: [phone]

## 2020-07-13 ENCOUNTER — Inpatient Hospital Stay (HOSPITAL_COMMUNITY): Payer: BC Managed Care – PPO

## 2020-07-13 LAB — CBC WITH DIFFERENTIAL/PLATELET
Abs Immature Granulocytes: 0.02 10*3/uL (ref 0.00–0.07)
Basophils Absolute: 0 10*3/uL (ref 0.0–0.1)
Basophils Relative: 0 %
Eosinophils Absolute: 0.1 10*3/uL (ref 0.0–0.5)
Eosinophils Relative: 1 %
HCT: 23.2 % — ABNORMAL LOW (ref 39.0–52.0)
Hemoglobin: 7.4 g/dL — ABNORMAL LOW (ref 13.0–17.0)
Immature Granulocytes: 0 %
Lymphocytes Relative: 18 %
Lymphs Abs: 1.4 10*3/uL (ref 0.7–4.0)
MCH: 30.6 pg (ref 26.0–34.0)
MCHC: 31.9 g/dL (ref 30.0–36.0)
MCV: 95.9 fL (ref 80.0–100.0)
Monocytes Absolute: 1 10*3/uL (ref 0.1–1.0)
Monocytes Relative: 13 %
Neutro Abs: 5.3 10*3/uL (ref 1.7–7.7)
Neutrophils Relative %: 68 %
Platelets: 142 10*3/uL — ABNORMAL LOW (ref 150–400)
RBC: 2.42 MIL/uL — ABNORMAL LOW (ref 4.22–5.81)
RDW: 13.5 % (ref 11.5–15.5)
WBC: 7.8 10*3/uL (ref 4.0–10.5)
nRBC: 0 % (ref 0.0–0.2)

## 2020-07-13 LAB — BASIC METABOLIC PANEL
Anion gap: 8 (ref 5–15)
BUN: 10 mg/dL (ref 8–23)
CO2: 22 mmol/L (ref 22–32)
Calcium: 7.8 mg/dL — ABNORMAL LOW (ref 8.9–10.3)
Chloride: 106 mmol/L (ref 98–111)
Creatinine, Ser: 0.89 mg/dL (ref 0.61–1.24)
GFR, Estimated: >60 mL/min (ref >60)
Glucose, Bld: 112 mg/dL — ABNORMAL HIGH (ref 70–99)
Potassium: 3.5 mmol/L (ref 3.5–5.1)
Sodium: 136 mmol/L (ref 135–145)

## 2020-07-13 LAB — URINE CULTURE: Culture: <10000 — AB

## 2020-07-13 LAB — HEMOGLOBIN AND HEMATOCRIT, BLOOD
HCT: 24.9 % — ABNORMAL LOW (ref 39.0–52.0)
Hemoglobin: 7.8 g/dL — ABNORMAL LOW (ref 13.0–17.0)

## 2020-07-13 MED ORDER — POTASSIUM CHLORIDE CRYS ER 20 MEQ PO TBCR
40.0000 meq | EXTENDED_RELEASE_TABLET | Freq: Once | ORAL | Status: AC
  Administered 2020-07-13: 40 meq via ORAL
  Filled 2020-07-13: qty 2

## 2020-07-13 NOTE — Progress Notes (Signed)
Pt ambulated in hallway with NT assist. Tolerated well. No clots noted in foley catheter. Urine remains clear, pink in color. Will change diet order per MD.

## 2020-07-13 NOTE — Discharge Summary (Signed)
Date of admission: 07/10/2020  Date of discharge: 07/13/2020  Admission diagnosis: BPH  Discharge diagnosis: BPH  Secondary diagnoses: none  History and Physical: For full details, please see admission history and physical. Briefly, Justin Bates is a 59 y.o. year old patient with BPH who is s/p TURP 11/29 for a 160gram prostate. HoLEP equipment was missing a piece so we proceeded with a TURP.  Hospital Course: The patient recovered in the usual expected fashion.  He had his diet advanced slowly.  Initially managed with IV pain control, then transitioned to PO meds when he was tolerating oral intake.  He did have some bleeding with a drop in his hemoglobin that remained stable over the last couple of days of his hospital stay. His last hemoglobin is 7.8 on 12/2. He was discharged to home on POD#3.  At the time of discharge the patient was tolerating a regular diet, passing flatus, ambulating, had adequate pain control and was agreeable to discharge. Urine remained clear in his foley.  Follow up as scheduled on Monday for void trial. He was given strict return precautions to call office or present to [facility] ED if he develops significant hematuria that may require transfusion or possible intervention with PAE. However, at present, his urine is clear and he is in need of no further procedures.  Laboratory values:  Recent Labs    07/12/20 1355 07/13/20 0552 07/13/20 1135  HGB 7.2* 7.4* 7.8*  HCT 22.4* 23.2* 24.9*   Recent Labs    07/12/20 0557 07/13/20 0552  CREATININE 1.19 0.89    Disposition: Home  Discharge instruction: The patient was instructed to be ambulatory but told to refrain from heavy lifting, strenuous activity, or driving.  Discharge medications:  Allergies as of 07/13/2020      Reactions   Metformin    GI Upset (intolerance)      Medication List    TAKE these medications   acetaminophen 500 MG tablet Commonly known as: TYLENOL Take 500 mg by mouth every 6 (six)  hours as needed for moderate pain or headache.   alfuzosin 10 MG 24 hr tablet Commonly known as: UROXATRAL Take 10 mg by mouth daily with breakfast.   amLODipine 5 MG tablet Commonly known as: NORVASC Take 5 mg by mouth daily.   celecoxib 200 MG capsule Commonly known as: CeleBREX Take 1 capsule (200 mg total) by mouth 2 (two) times daily.   cephALEXin 500 MG capsule Commonly known as: KEFLEX Take 1 capsule (500 mg total) by mouth 2 (two) times daily for 6 doses.   Dialyvite Vitamin D 5000 125 MCG (5000 UT) capsule Generic drug: Cholecalciferol Take 5,000 Units by mouth daily.   docusate sodium 100 MG capsule Commonly known as: Colace Take 1 capsule (100 mg total) by mouth daily as needed.   oxyCODONE-acetaminophen 5-325 MG tablet Commonly known as: Percocet Take 1 tablet by mouth every 4 (four) hours as needed for up to 12 doses for severe pain.   SALONPAS PAIN RELIEF PATCH EX Apply 1 patch topically daily as needed (pain).   spironolactone 50 MG tablet Commonly known as: ALDACTONE Take 50 mg by mouth daily.   tolterodine 4 MG 24 hr capsule Commonly known as: DETROL LA Take 4 mg by mouth daily.       Followup:   Follow-up Information    [facility] Follow up on 07/17/2020.   Why: Scheduled for 8:30 AM Contact information: [place]  [facility]  Pager: [phone]

## 2020-07-13 NOTE — Progress Notes (Signed)
3 Days Post-Op Subjective: Denies pain. No nausea or emesis. Foley clear, no clots. Hgb returned 7.8.  Objective: Vital signs in last 24 hours: Temp:  [98.7 F (37.1 C)-100 F (37.8 C)] 98.7 F (37.1 C) (12/02 0439) Pulse Rate:  [87-91] 91 (12/02 0439) BP: (140-142)/(82-89) 142/89 (12/02 0439) SpO2:  [96 %-98 %] 98 % (12/02 0439)  Intake/Output from previous day: 12/01 0701 - 12/02 0700 In: 1643.4 [P.O.:240; I.V.:1303.4; IV Piggyback:100] Out: 4075 [Urine:4075] Intake/Output this shift: Total I/O In: 616.2 [P.O.:240; I.V.:376.2] Out: -   Physical Exam:  General: Alert and oriented CV: RRR Lungs: Clear Abdomen: Soft, ND, NT Ext: NT, No erythema Foley: clear, no clots, irrigates without clots, filled 76ml into balloon so now it holds 24ml  Lab Results: Recent Labs    07/12/20 1355 07/13/20 0552 07/13/20 1135  HGB 7.2* 7.4* 7.8*  HCT 22.4* 23.2* 24.9*   BMET Recent Labs    07/12/20 0557 07/13/20 0552  NA 139 136  K 3.6 3.5  CL 108 106  CO2 23 22  GLUCOSE 121* 112*  BUN 15 10  CREATININE 1.19 0.89  CALCIUM 7.7* 7.8*     Studies/Results: CT ANGIO PELVIS W OR WO CONTRAST  Result Date: 07/12/2020 CLINICAL DATA:  59 year old male with history of prostatomegaly and lower urinary tract symptoms status post status post trans urethral resection of the prostate on 63/33/5456 complicated postoperatively by persistent hematuria. EXAM: CT ANGIOGRAPHY PELVIS TECHNIQUE: Non-contrast CT of the chest was initially obtained. Multidetector CT imaging through the pelvis was performed using the standard protocol during and after bolus administration of intravenous contrast. Multiplanar reconstructed images and MIPs were obtained and reviewed to evaluate the vascular anatomy. CONTRAST:  127mL OMNIPAQUE IOHEXOL 350 MG/ML SOLN COMPARISON:  CT abdomen and pelvis from 12/17/2015 FINDINGS: CTA PELVIS FINDINGS VASCULAR Aorta: The visualized distal aorta is normal in caliber and patent.  Inflow: Patent without evidence of aneurysm, dissection, vasculitis or significant stenosis. Of note, there is a left-sided collateral artery between the inferior epigastric and left obturator artery (AKA corona mortis). Proximal Outflow: Patent without evidence of aneurysm, dissection, vasculitis, or significant stenosis. Veins: Normal caliber.  Patent. Review of the MIP images confirms the above findings. NON-VASCULAR Ureters/Bladder: Foley catheter in place. Scattered foci of gas within the bladder, as expected after instrumentation. There is high-density material layering in the otherwise decompressed bladder compatible with blood products. Bowel: The visualized large and small bowel are normal in course and caliber without evidence of wall thickening. The appendix appears normal. Lymphatic: No pelvic lymphadenopathy. Reproductive: Postsurgical changes after trans urethral resection of the prostate which measures approximately 6 cm in maximum axial dimension. Heterogeneous attenuation within the prostate gland, though no evidence of active extravasation. Other: No pelvic ascites. Musculoskeletal: No acute osseous abnormality. No aggressive appearing osseous lesion. Review of the MIP images confirms the above findings. IMPRESSION: 1. Recent postsurgical changes after transurethral resection of the prostate with indwelling large volume Foley catheter in place. There are layering blood products within the urinary bladder, however no evidence of active extravasation. 2. 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 3. 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 Ruthann Cancer, MD [facility] Electronically Signed   By: Ruthann Cancer MD   On: 07/12/2020 13:21   US PELVIS LIMITED (TRANSABDOMINAL ONLY)  Result Date: 07/13/2020 CLINICAL DATA:  Prostate  resection 3 days ago. Evaluate  bladder thrombus. Foley catheter within the bladder. EXAM: LIMITED ULTRASOUND OF PELVIS TECHNIQUE: Limited transabdominal ultrasound examination of the pelvis was performed. COMPARISON:  CT, 07/12/2020. FINDINGS: Bladder is mostly decompressed with a Foley catheter. Wall is thickened to 8 mm. There are some internal echoes within the bladder but no defined thrombus. The limited bladder distension and presence of the Foley catheter limits the sonographic assessment. IMPRESSION: 1. Thickened bladder wall extension weighted by lack of bladder distension. 2. Foley catheter present within the bladder. No convincing bladder mass or thrombus. Electronically Signed   By: Lajean Manes M.D.   On: 07/13/2020 08:22    Assessment/Plan: 1. BPH s/p TURP 11/29 for a 160gram prostate. HoLEP equipment was missing a piece so we proceeded with a TURP.  Reviewed RBUS. No clots. Urine remains clear. Repeat hgb 7.8. Irrigates without clots. He has ambulated several times this morning without bleeding. [place] for discharge home. Gave strict return precautions to call the office or present to [facility] ED if he develops significant hematuria.   LOS: 2 days   Matt R. Yerick Eggebrecht MD 07/13/2020, 12:44 PM [facility]  Pager: [phone]

## 2020-07-13 NOTE — Plan of Care (Signed)
  Problem: Education: Goal: Knowledge of General Education information will improve Description: Including pain rating scale, medication(s)/side effects and non-pharmacologic comfort measures Outcome: Progressing   Problem: Clinical Measurements: Goal: Will remain free from infection Outcome: Progressing   Problem: Clinical Measurements: Goal: Diagnostic test results will improve Outcome: Progressing   Problem: Activity: Goal: Risk for activity intolerance will decrease Outcome: Progressing   Problem: Nutrition: Goal: Adequate nutrition will be maintained Outcome: Progressing   Problem: Elimination: Goal: Will not experience complications related to urinary retention Outcome: Progressing

## 2020-07-13 NOTE — Progress Notes (Signed)
3 Days Post-Op Subjective: Denies pain. No nausea or emesis. No CP or SOB. Afebrile.  Objective: Vital signs in last 24 hours: Temp:  [98.7 F (37.1 C)-100 F (37.8 C)] 98.7 F (37.1 C) (12/02 0439) Pulse Rate:  [87-93] 91 (12/02 0439) Resp:  [18] 18 (12/01 1243) BP: (132-147)/(82-93) 142/89 (12/02 0439) SpO2:  [96 %-98 %] 98 % (12/02 0439)  Intake/Output from previous day: 12/01 0701 - 12/02 0700 In: 1643.4 [P.O.:240; I.V.:1303.4; IV Piggyback:100] Out: 3675 [Urine:3675] Intake/Output this shift: No intake/output data recorded.  Physical Exam:  General: Alert and oriented CV: RRR Lungs: Clear Abdomen: Soft, ND, NT Ext: NT, No erythema  Lab Results: Recent Labs    07/12/20 0557 07/12/20 1355 07/13/20 0552  HGB 7.2* 7.2* 7.4*  HCT 22.5* 22.4* 23.2*   BMET Recent Labs    07/12/20 0557 07/13/20 0552  NA 139 136  K 3.6 3.5  CL 108 106  CO2 23 22  GLUCOSE 121* 112*  BUN 15 10  CREATININE 1.19 0.89  CALCIUM 7.7* 7.8*     Studies/Results: CT ANGIO PELVIS W OR WO CONTRAST  Result Date: 07/12/2020 CLINICAL DATA:  59 year old male with history of prostatomegaly and lower urinary tract symptoms status post status post trans urethral resection of the prostate on 16/05/9603 complicated postoperatively by persistent hematuria. EXAM: CT ANGIOGRAPHY PELVIS TECHNIQUE: Non-contrast CT of the chest was initially obtained. Multidetector CT imaging through the pelvis was performed using the standard protocol during and after bolus administration of intravenous contrast. Multiplanar reconstructed images and MIPs were obtained and reviewed to evaluate the vascular anatomy. CONTRAST:  134mL OMNIPAQUE IOHEXOL 350 MG/ML SOLN COMPARISON:  CT abdomen and pelvis from 12/17/2015 FINDINGS: CTA PELVIS FINDINGS VASCULAR Aorta: The visualized distal aorta is normal in caliber and patent. Inflow: Patent without evidence of aneurysm, dissection, vasculitis or significant stenosis. Of note, there  is a left-sided collateral artery between the inferior epigastric and left obturator artery (AKA corona mortis). Proximal Outflow: Patent without evidence of aneurysm, dissection, vasculitis, or significant stenosis. Veins: Normal caliber.  Patent. Review of the MIP images confirms the above findings. NON-VASCULAR Ureters/Bladder: Foley catheter in place. Scattered foci of gas within the bladder, as expected after instrumentation. There is high-density material layering in the otherwise decompressed bladder compatible with blood products. Bowel: The visualized large and small bowel are normal in course and caliber without evidence of wall thickening. The appendix appears normal. Lymphatic: No pelvic lymphadenopathy. Reproductive: Postsurgical changes after trans urethral resection of the prostate which measures approximately 6 cm in maximum axial dimension. Heterogeneous attenuation within the prostate gland, though no evidence of active extravasation. Other: No pelvic ascites. Musculoskeletal: No acute osseous abnormality. No aggressive appearing osseous lesion. Review of the MIP images confirms the above findings. IMPRESSION: 1. Recent postsurgical changes after transurethral resection of the prostate with indwelling large volume Foley catheter in place. There are layering blood products within the urinary bladder, however no evidence of active extravasation. 2. 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 3. 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 Ruthann Cancer, MD [facility] Electronically Signed   By: Ruthann Cancer MD   On: 07/12/2020 13:21    Assessment/Plan: 1. BPH s/p TURP 11/29 for a 160gram prostate. HoLEP equipment was missing a piece so we proceeded with a TURP.  -Foley was on rubber band traction all night with 52ml in balloon.  The CBI has been clamped for about 24 hours. I removed 61ml from balloon so now there is only 48ml remaining. I irrigated out old clot until there was no further clot. Irrigant is clear yellow. No sign of bleeding. -Obtain bladder ultrasound to ensure no organized clot in bladder -Keep NPO for possible procedure pending ultrasound -Hgb stable at 7.4 this AM from 7.2 yesterday AM -Likely hold off on PAE as no further bleeding. Appreciate consultation with IR. -Replete K -If ultrasound shows no clot and urine remains clear off of traction, will discharge home today with plan for void trial in office on Monday   LOS: 2 days   Matt R. Daveigh Batty MD 07/13/2020, 7:09 AM [facility]  Pager: [phone]

## 2020-07-13 NOTE — Progress Notes (Signed)
Resumed care for this pt at 0300. I agree with previous RN's assessment. Pt resting comfortably in bed. CBI continues to be clamped.

## 2020-07-13 NOTE — Plan of Care (Signed)

## 2020-07-13 NOTE — Progress Notes (Signed)
Pt discharged home today per Dr. Abner Greenspan. Pt's IV site D/C'd and WDL. Pt's VSS. Pt provided with home medication list, discharge instructions and prescriptions. Verbalized understanding. Pt reeducated on at home use of foley catheter. Return demonstration satisfactory. Pt left floor via WC in stable condition accompanied by RN.

## 2020-07-19 ENCOUNTER — Other Ambulatory Visit (HOSPITAL_COMMUNITY): Payer: Self-pay | Admitting: Urology

## 2020-07-19 ENCOUNTER — Ambulatory Visit (HOSPITAL_COMMUNITY)
Admission: RE | Admit: 2020-07-19 | Discharge: 2020-07-19 | Disposition: A | Discharge status: Home / Self Care | Payer: BC Managed Care – PPO | Source: Ambulatory Visit | Attending: Physician Assistant | Admitting: Physician Assistant

## 2020-07-19 ENCOUNTER — Other Ambulatory Visit: Payer: Self-pay

## 2020-07-19 DIAGNOSIS — I82492 Acute embolism and thrombosis of other specified deep vein of left lower extremity: Secondary | ICD-10-CM | POA: Diagnosis not present

## 2020-08-25 ENCOUNTER — Ambulatory Visit: Payer: BC Managed Care – PPO | Attending: Internal Medicine

## 2020-08-25 ENCOUNTER — Other Ambulatory Visit (HOSPITAL_BASED_OUTPATIENT_CLINIC_OR_DEPARTMENT_OTHER): Payer: Self-pay | Admitting: Internal Medicine

## 2020-08-25 DIAGNOSIS — Z23 Encounter for immunization: Secondary | ICD-10-CM

## 2020-08-25 NOTE — Progress Notes (Signed)
   Covid-19 Vaccination Clinic  Name:  Justin Bates    MRN: [id number] DOB: [birth year]  08/25/2020  [name] was observed post Covid-19 immunization for 15 minutes without incident. He was provided with Vaccine Information Sheet and instruction to access the V-Safe system.   [name] was instructed to call 911 with any severe reactions post vaccine: Marland Kitchen Difficulty breathing  . Swelling of face and throat  . A fast heartbeat  . A bad rash all over body  . Dizziness and weakness   Immunizations Administered    Name Date Dose VIS Date Route   Moderna Covid-19 Booster Vaccine 08/25/2020 11:00 AM 0.25 mL 05/31/2020 Intramuscular   Manufacturer: Levan Hurst   Lot: [id number]   Platter: [id number]

## 2020-08-29 MED FILL — MODERNA COVID-19 VACCINE 10: 100 | 28 days supply | Qty: 0 | Fill #0

## 2024-04-30 ENCOUNTER — Encounter (HOSPITAL_BASED_OUTPATIENT_CLINIC_OR_DEPARTMENT_OTHER): Payer: Self-pay | Admitting: *Deleted

## 2024-04-30 ENCOUNTER — Emergency Department (HOSPITAL_BASED_OUTPATIENT_CLINIC_OR_DEPARTMENT_OTHER)

## 2024-04-30 ENCOUNTER — Emergency Department (HOSPITAL_BASED_OUTPATIENT_CLINIC_OR_DEPARTMENT_OTHER)
Admission: EM | Admit: 2024-04-30 | Discharge: 2024-04-30 | Disposition: A | Discharge status: Home / Self Care | Attending: Emergency Medicine | Admitting: Emergency Medicine

## 2024-04-30 ENCOUNTER — Other Ambulatory Visit: Payer: Self-pay

## 2024-04-30 DIAGNOSIS — S0990XA Unspecified injury of head, initial encounter: Secondary | ICD-10-CM | POA: Insufficient documentation

## 2024-04-30 DIAGNOSIS — Y92410 Unspecified street and highway as the place of occurrence of the external cause: Secondary | ICD-10-CM | POA: Diagnosis not present

## 2024-04-30 DIAGNOSIS — M4807 Spinal stenosis, lumbosacral region: Secondary | ICD-10-CM | POA: Diagnosis present

## 2024-04-30 MED ORDER — MELOXICAM 7.5 MG PO TABS: 7.5000 mg | ORAL_TABLET | Freq: Every day | ORAL | 0 refills | Status: AC

## 2024-04-30 MED ORDER — NAPROXEN 250 MG PO TABS
500.0000 mg | ORAL_TABLET | Freq: Once | ORAL | Status: AC
  Administered 2024-04-30: 500 mg via ORAL
  Filled 2024-04-30: qty 2

## 2024-04-30 MED ORDER — LIDOCAINE 5 % EX PTCH: 1.0000 | MEDICATED_PATCH | CUTANEOUS | 0 refills | Status: AC

## 2024-04-30 MED ORDER — LIDOCAINE 5 % EX PTCH
3.0000 | MEDICATED_PATCH | CUTANEOUS | Status: DC
Start: 2024-04-30 — End: 2024-04-30
  Administered 2024-04-30: 3 via TRANSDERMAL
  Filled 2024-04-30: qty 3
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
